# Patient Record
Sex: Female | Born: 1960 | Race: White | Hispanic: No | Marital: Married | Smoking: Never smoker
Health system: Southern US, Community
[De-identification: ages and names within clinical notes are randomized; demographics above are authoritative.]

## PROBLEM LIST (undated history)

## (undated) DIAGNOSIS — C801 Malignant (primary) neoplasm, unspecified: Secondary | ICD-10-CM

## (undated) DIAGNOSIS — I4510 Unspecified right bundle-branch block: Secondary | ICD-10-CM

## (undated) DIAGNOSIS — T7840XA Allergy, unspecified, initial encounter: Secondary | ICD-10-CM

## (undated) HISTORY — PX: ELBOW SURGERY: SHX618

## (undated) HISTORY — PX: ABDOMINAL HYSTERECTOMY: SHX81

## (undated) HISTORY — PX: COLONOSCOPY: SHX174

## (undated) HISTORY — DX: Allergy, unspecified, initial encounter: T78.40XA

---

## 2010-01-03 ENCOUNTER — Encounter: Admission: RE | Admit: 2010-01-03 | Discharge: 2010-01-03 | Payer: Self-pay | Admitting: Gastroenterology

## 2010-02-14 ENCOUNTER — Encounter (HOSPITAL_COMMUNITY): Payer: Self-pay | Admitting: Obstetrics and Gynecology

## 2010-02-14 ENCOUNTER — Inpatient Hospital Stay (HOSPITAL_COMMUNITY): Admission: RE | Admit: 2010-02-14 | Discharge: 2010-02-15 | Payer: Self-pay | Admitting: Obstetrics and Gynecology

## 2011-02-01 LAB — COMPREHENSIVE METABOLIC PANEL
ALT: 20 U/L (ref 0–35)
Albumin: 4.3 g/dL (ref 3.5–5.2)
Alkaline Phosphatase: 36 U/L — ABNORMAL LOW (ref 39–117)
Calcium: 9.2 mg/dL (ref 8.4–10.5)
Creatinine, Ser: 0.73 mg/dL (ref 0.4–1.2)
GFR calc Af Amer: >60 mL/min (ref >60)
Potassium: 3.8 mEq/L (ref 3.5–5.1)
Total Protein: 7 g/dL (ref 6.0–8.3)

## 2011-02-01 LAB — CBC
HCT: 27.3 % — ABNORMAL LOW (ref 36.0–46.0)
HCT: 41.1 % (ref 36.0–46.0)
Hemoglobin: 14 g/dL (ref 12.0–15.0)
Hemoglobin: 9.4 g/dL — ABNORMAL LOW (ref 12.0–15.0)
MCHC: 34 g/dL (ref 30.0–36.0)
MCV: 92.6 fL (ref 78.0–100.0)
MCV: 93.3 fL (ref 78.0–100.0)
Platelets: 249 10*3/uL (ref 150–400)
RDW: 13 % (ref 11.5–15.5)
RDW: 13.1 % (ref 11.5–15.5)
WBC: 10.3 10*3/uL (ref 4.0–10.5)

## 2011-02-01 LAB — ABO/RH: ABO/RH(D): A POS

## 2011-02-01 LAB — PREGNANCY, URINE: Preg Test, Ur: NEGATIVE

## 2015-02-16 ENCOUNTER — Ambulatory Visit (INDEPENDENT_AMBULATORY_CARE_PROVIDER_SITE_OTHER): Payer: BLUE CROSS/BLUE SHIELD

## 2015-02-16 ENCOUNTER — Ambulatory Visit (INDEPENDENT_AMBULATORY_CARE_PROVIDER_SITE_OTHER): Payer: BLUE CROSS/BLUE SHIELD | Admitting: Podiatry

## 2015-02-16 ENCOUNTER — Encounter: Payer: Self-pay | Admitting: Podiatry

## 2015-02-16 VITALS — BP 103/59 | HR 67 | Resp 10 | Ht 68.0 in | Wt 136.0 lb

## 2015-02-16 DIAGNOSIS — M79672 Pain in left foot: Secondary | ICD-10-CM

## 2015-02-16 DIAGNOSIS — M79671 Pain in right foot: Secondary | ICD-10-CM

## 2015-02-16 DIAGNOSIS — M2042 Other hammer toe(s) (acquired), left foot: Secondary | ICD-10-CM | POA: Diagnosis not present

## 2015-02-16 DIAGNOSIS — M779 Enthesopathy, unspecified: Secondary | ICD-10-CM

## 2015-02-16 MED ORDER — TRIAMCINOLONE ACETONIDE 10 MG/ML IJ SUSP
10.0000 mg | Freq: Once | INTRAMUSCULAR | Status: AC
  Administered 2015-02-16: 10 mg

## 2015-02-16 NOTE — Progress Notes (Signed)
   Subjective:    Patient ID: Terri Salazar, female    DOB: [birth year], 54 y.o.   MRN: [id number]  HPI Comments: Pt states she has suffered with the hard rock sensation in the shoe against the left 2-3MPJ area for over 3 years, and has received 3 cortisone injections from Dr. Ballard Russell.  Pt states she has changed to very padded shoes that help some.  Pt states right lower ankle and foot began 3 weeks ago, after training for a 5K, but resting has helped a little.   Foot Pain Associated symptoms include congestion, diaphoresis and headaches.      Review of Systems  Constitutional: Positive for diaphoresis.  HENT: Positive for congestion and sinus pressure.   Cardiovascular: Positive for palpitations.       Poor circulation per pt.  Neurological: Positive for headaches.  Hematological: Bruises/bleeds easily.  All other systems reviewed and are negative.      Objective:   Physical Exam        Assessment & Plan:

## 2015-02-17 NOTE — Progress Notes (Signed)
Subjective:     Patient ID: Terri Salazar, female   DOB: [birth year], 54 y.o.   MRN: [id number]  HPI patient presents stating that I'm having pain in my left second MPJ and that that's been present for several years. I had several cortisone injection along time ago and wear padded shoes but it continues to bother me and I do think that the left second toe may be moving slightly towards the big toe and elevated. My right ankle was bothering me after running and training 45   Review of Systems  All other systems reviewed and are negative.      Objective:   Physical Exam  Constitutional: She is oriented to person, place, and time.  Cardiovascular: Intact distal pulses.   Musculoskeletal: Normal range of motion.  Neurological: She is oriented to person, place, and time.  Skin: Skin is warm.  Nursing note and vitals reviewed.  neurovascular status intact with muscle strength adequate and range of motion subtalar midtarsal joint within normal limits. Patient's noted to have good digital perfusion is well oriented 3 and is noted on the left foot there is mild dorsal and medial dislocation of the second metatarsophalangeal joint with inflammation and fluid within the joint itself. I also noted some mass feeling in the third interspace left but that's nonpainful and there is mild discomfort on the lateral side of the right ankle     Assessment:     Probable inflammatory capsulitis with possible flexor plate stretch or dislocation second MPJ left along with possible neuroma symptomatology non- symptomatic at the time left with mild ankle sprain right that is not significantly painful at this time    Plan:     H&P and all conditions discussed. Were to focus on the second MPJ left and today I went ahead and I did a proximal nerve block and after appropriate numbness I aspirated was able to get out a small amount of clear fluid and injected with half cc of dexamethasone Kenalog. I then applied thick  plantar padding to reduce pressure and discussed long-term orthotics depending on the response or the possibility long-term surgical intervention. Reviewed x-rays on the right foot and do not see reason at this time for treatment but she will utilize ice and support

## 2015-03-01 ENCOUNTER — Ambulatory Visit (INDEPENDENT_AMBULATORY_CARE_PROVIDER_SITE_OTHER): Payer: BLUE CROSS/BLUE SHIELD | Admitting: Podiatry

## 2015-03-01 DIAGNOSIS — M779 Enthesopathy, unspecified: Secondary | ICD-10-CM | POA: Diagnosis not present

## 2015-03-01 DIAGNOSIS — M2042 Other hammer toe(s) (acquired), left foot: Secondary | ICD-10-CM | POA: Diagnosis not present

## 2015-03-02 NOTE — Progress Notes (Signed)
Subjective:     Patient ID: Terri Salazar, female   DOB: 1961-10-01, 54 y.o.   MRN: [id number]  HPI patient states I was able to run my 5K on my left foot and while I'm still getting discomfort  Really seems to make a difference   Review of Systems     Objective:   Physical Exam Neurovascular status intact muscle strength adequate with diminished discomfort of the second MPJ left with fluid buildup still noted and pain when palpated deeply    Assessment:     Capsulitis improved but still present second MPJ left    Plan:     Dispensed and explained padding to patient and scanned for custom orthotics to reduce stress against the joint. Patient will be seen back to recheck again in orthotics are returned

## 2015-03-19 ENCOUNTER — Ambulatory Visit: Payer: BLUE CROSS/BLUE SHIELD | Admitting: *Deleted

## 2015-03-19 DIAGNOSIS — M779 Enthesopathy, unspecified: Secondary | ICD-10-CM

## 2015-03-19 NOTE — Patient Instructions (Signed)

## 2015-03-19 NOTE — Progress Notes (Signed)
Patient ID: Terri Salazar, female   DOB: 1961/04/27, 54 y.o.   MRN: [id number] PICKING UP INSERTS

## 2015-08-25 ENCOUNTER — Other Ambulatory Visit: Payer: Self-pay | Admitting: Gastroenterology

## 2015-08-25 DIAGNOSIS — C187 Malignant neoplasm of sigmoid colon: Secondary | ICD-10-CM

## 2015-08-26 ENCOUNTER — Other Ambulatory Visit: Payer: Self-pay | Admitting: Gastroenterology

## 2015-08-26 ENCOUNTER — Ambulatory Visit (HOSPITAL_COMMUNITY)
Admission: RE | Admit: 2015-08-26 | Discharge: 2015-08-26 | Disposition: A | Discharge status: Home / Self Care | Payer: BLUE CROSS/BLUE SHIELD | Source: Ambulatory Visit | Attending: Gastroenterology | Admitting: Gastroenterology

## 2015-08-26 ENCOUNTER — Encounter (HOSPITAL_COMMUNITY): Payer: Self-pay

## 2015-08-26 DIAGNOSIS — D1803 Hemangioma of intra-abdominal structures: Secondary | ICD-10-CM | POA: Diagnosis not present

## 2015-08-26 DIAGNOSIS — C187 Malignant neoplasm of sigmoid colon: Secondary | ICD-10-CM

## 2015-08-26 HISTORY — DX: Malignant (primary) neoplasm, unspecified: C80.1

## 2015-08-26 MED ORDER — IOHEXOL 300 MG/ML  SOLN
100.0000 mL | Freq: Once | INTRAMUSCULAR | Status: AC | PRN
  Administered 2015-08-26: 100 mL via INTRAVENOUS

## 2015-08-31 ENCOUNTER — Encounter: Payer: Self-pay | Admitting: General Surgery

## 2015-08-31 NOTE — Progress Notes (Signed)
Lajean Silvius 08/31/2015 10:24 AM Location: [facility] Patient #: [id number] DOB: [birth year] Married / Language: Cleophus Molt / Race: White Female  History of Present Illness Odis Hollingshead MD; 08/31/2015 11:31 AM) The patient is a 54 year old female.   Note:She is referred by Dr. 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 Her husband is with her. She saw Dr. Collene Mares because she was having some fecal urgency at times area also had some rectal bleeding. During the summer, she had a couple episodes of fecal incontinence while running or walking on hot days. As the weather has cooled down, she's not had this problem. Colonoscopy demonstrated a lesion at the rectosigmoid junction. Biopsy was performed and the area was marked. 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 CT of the chest and abdomen did not demonstrate any obvious evidence of metastatic disease. No family history of colorectal cancer. No unintended weight loss. No abdominal pain or pelvic pain.  Other Problems Elbert Ewings, CMA; 08/31/2015 10:24 AM) Back Pain Hemorrhoids Rectal Cancer  Past Surgical History Elbert Ewings, CMA; 08/31/2015 10:24 AM) Colon Polyp Removal - Colonoscopy Hysterectomy (not due to cancer) - Partial Oral Surgery  Diagnostic Studies History Elbert Ewings, CMA; 08/31/2015 10:24 AM) Colonoscopy >10 years ago Mammogram >3 years ago Pap Smear >5 years ago  Allergies Elbert Ewings, CMA; 08/31/2015 10:24 AM) No Known Drug Allergies 08/31/2015  Medication History Elbert Ewings, CMA; 08/31/2015 10:25 AM) Glucosamine (500MG  Capsule, Oral) Active. Vitamin D3 Active. Multiple Vitamin (Oral) Active. Medications Reconciled  Social History Elbert Ewings, [place]; 08/31/2015 10:24 AM) Alcohol use Occasional alcohol use. Caffeine use Carbonated beverages, Coffee, Tea. No drug use Tobacco use Never smoker.  Family History Elbert Ewings, [place];  08/31/2015 10:24 AM) Alcohol Abuse Family Members In General. Arthritis Father, Mother. Bleeding disorder Family Members In General. Depression Family Members In General. Melanoma Father. Migraine Headache Mother, Sister. Thyroid problems Mother.  Pregnancy / Birth History Elbert Ewings, [place]; 08/31/2015 10:24 AM) Age at menarche 22 years. Age of menopause 51-55 Contraceptive History Oral contraceptives. Gravida 1 Irregular periods Maternal age 64-40 Para 1     Review of Systems Elbert Ewings CMA; 08/31/2015 10:24 AM) General Present- Fatigue and Night Sweats. Not Present- Appetite Loss, Chills, Fever, Weight Gain and Weight Loss. Skin Not Present- Change in Wart/Mole, Dryness, Hives, Jaundice, New Lesions, Non-Healing Wounds, Rash and Ulcer. HEENT Present- Seasonal Allergies, Sinus Pain, Sore Throat and Wears glasses/contact lenses. Not Present- Earache, Hearing Loss, Hoarseness, Nose Bleed, Oral Ulcers, Ringing in the Ears, Visual Disturbances and Yellow Eyes. Respiratory Not Present- Bloody sputum, Chronic Cough, Difficulty Breathing, Snoring and Wheezing. Breast Not Present- Breast Mass, Breast Pain, Nipple Discharge and Skin Changes. Cardiovascular Not Present- Chest Pain, Difficulty Breathing Lying Down, Leg Cramps, Palpitations, Rapid Heart Rate, Shortness of Breath and Swelling of Extremities. Gastrointestinal Present- Bloating, Change in Bowel Habits, Excessive gas, Nausea and Rectal Pain. Not Present- Abdominal Pain, Bloody Stool, Chronic diarrhea, Constipation, Difficulty Swallowing, Gets full quickly at meals, Hemorrhoids, Indigestion and Vomiting. Female Genitourinary Not Present- Frequency, Nocturia, Painful Urination, Pelvic Pain and Urgency. Musculoskeletal Present- Back Pain, Joint Pain and Joint Stiffness. Not Present- Muscle Pain, Muscle Weakness and Swelling of Extremities. Neurological Present- Headaches. Not Present- Decreased Memory, Fainting,  Numbness, Seizures, Tingling, Tremor, Trouble walking and Weakness. Psychiatric Present- Anxiety and Fearful. Not Present- Bipolar, Change in Sleep Pattern, Depression and Frequent crying. Endocrine Present- Hot flashes. Not Present- Cold Intolerance, Excessive Hunger, Hair Changes, Heat Intolerance and New  Diabetes. Hematology Not Present- Easy Bruising, Excessive bleeding, Gland problems, HIV and Persistent Infections.  Vitals Elbert Ewings CMA; 08/31/2015 10:25 AM) 08/31/2015 10:25 AM Weight: 126.4 lb Height: 68in Body Surface Area: 1.68 m Body Mass Index: 19.22 kg/m  Temp.: 97.61F(Temporal)  Pulse: 74 (Regular)  BP: 122/78 (Sitting, Left Arm, Standard)      Physical Exam Odis Hollingshead MD; 08/31/2015 11:34 AM)  The physical exam findings are as follows: Note:General: Thin female in NAD. Pleasant and cooperative.  HEENT: [place]/AT, no facial masses  EYES: EOMI, no icterus  NECK: Supple, no obvious mass or thyroid enlargement.  CV: RRR, no murmur, no JVD.  CHEST: Breath sounds equal and clear. Respirations nonlabored.  ABDOMEN: Soft, nontender, nondistended, no masses, no organomegaly, active bowel sounds, small lower abdominal scars, no hernias.  ANORECTAL: No fissures. Slightly decreased sphincter tone. No masses.  MUSCULOSKELETAL: FROM, good muscle tone, no edema, no venous stasis changes  LYMPHATIC: No palpable cervical or inguinal adenopathy.  SKIN: No jaundice or suspicious rashes.  NEUROLOGIC: Alert and oriented, answers questions appropriately, normal gait and station.  PSYCHIATRIC: Normal mood, affect , and behavior.    Assessment & Plan Odis Hollingshead MD; 08/31/2015 11:06 AM)  CANCER OF RECTOSIGMOID (COLON) (C19) Impression: No evidence of metastatic disease on CT of chest or abdomen.  Plan: Laparoscopic-assisted low anterior resection. I have explained the procedure and risks of colon resection. Risks include but are not limited to  bleeding, infection, wound problems, anesthesia, anastomotic leak, need for colostomy, need for reoperative surgery, injury to intraabominal organs (such as intestine, spleen, kidney, bladder, ureter, etc.), ileus, irregular bowel habits. She seems to understand and agrees to proceed.  FECAL SOILING DUE TO FECAL INCONTINENCE (R15.9) Impression: This occurred when she was running and walking only during the summer. It has not occurred recently as weather has cooled down. She does have decreased sphincter tone on exam.  Plan: Pelvic floor exercises and referral for pelvic floor PT preop. Would not do a sphincteroplasty at the same time as I don't think this is indicated in her current state.  Jackolyn Confer, MD

## 2015-09-10 ENCOUNTER — Encounter (HOSPITAL_COMMUNITY): Payer: Self-pay

## 2015-09-10 ENCOUNTER — Encounter (HOSPITAL_COMMUNITY)
Admission: RE | Admit: 2015-09-10 | Discharge: 2015-09-10 | Disposition: A | Discharge status: Home / Self Care | Payer: BLUE CROSS/BLUE SHIELD | Source: Ambulatory Visit | Attending: General Surgery | Admitting: General Surgery

## 2015-09-10 HISTORY — DX: Unspecified right bundle-branch block: I45.10

## 2015-09-10 LAB — CBC WITH DIFFERENTIAL/PLATELET
BASOS ABS: 0.1 10*3/uL (ref 0.0–0.1)
Basophils Relative: 1 %
Eosinophils Absolute: 0 10*3/uL (ref 0.0–0.7)
Eosinophils Relative: 1 %
HEMATOCRIT: 39.7 % (ref 36.0–46.0)
HEMOGLOBIN: 13.5 g/dL (ref 12.0–15.0)
LYMPHS PCT: 39 %
Lymphs Abs: 2.2 10*3/uL (ref 0.7–4.0)
MCH: 30.5 pg (ref 26.0–34.0)
MCHC: 34 g/dL (ref 30.0–36.0)
MCV: 89.8 fL (ref 78.0–100.0)
Monocytes Absolute: 0.5 10*3/uL (ref 0.1–1.0)
Monocytes Relative: 9 %
NEUTROS ABS: 2.9 10*3/uL (ref 1.7–7.7)
NEUTROS PCT: 50 %
Platelets: 272 10*3/uL (ref 150–400)
RBC: 4.42 MIL/uL (ref 3.87–5.11)
RDW: 12.6 % (ref 11.5–15.5)
WBC: 5.7 10*3/uL (ref 4.0–10.5)

## 2015-09-10 LAB — COMPREHENSIVE METABOLIC PANEL
ALBUMIN: 4.6 g/dL (ref 3.5–5.0)
ALT: 18 U/L (ref 14–54)
AST: 20 U/L (ref 15–41)
Alkaline Phosphatase: 61 U/L (ref 38–126)
Anion gap: 8 (ref 5–15)
BILIRUBIN TOTAL: 0.5 mg/dL (ref 0.3–1.2)
BUN: 15 mg/dL (ref 6–20)
CO2: 27 mmol/L (ref 22–32)
CREATININE: 0.72 mg/dL (ref 0.44–1.00)
Calcium: 9.6 mg/dL (ref 8.9–10.3)
Chloride: 106 mmol/L (ref 101–111)
GFR calc Af Amer: >60 mL/min (ref >60)
GLUCOSE: 99 mg/dL (ref 65–99)
Potassium: 4.6 mmol/L (ref 3.5–5.1)
Sodium: 141 mmol/L (ref 135–145)
TOTAL PROTEIN: 7.1 g/dL (ref 6.5–8.1)

## 2015-09-10 LAB — PROTIME-INR
INR: 1.01 (ref 0.00–1.49)
PROTHROMBIN TIME: 13.5 s (ref 11.6–15.2)

## 2015-09-10 LAB — ABO/RH: ABO/RH(D): A POS

## 2015-09-10 NOTE — Patient Instructions (Addendum)
Terri Salazar  09/10/2015   Your procedure is scheduled on: Monday 09-13-15  Report to [facility] Main  Entrance take [facility]  elevators to 3rd floor to  [place] [facility] [place].  Call this number if you have problems the morning of surgery [phone]   Remember: ONLY 1 PERSON MAY GO WITH YOU TO SHORT STAY TO GET  READY MORNING OF [place].              Follow Bowel prep instructions from Dr. Zella Richer               Do not eat food or drink liquids :After Midnight.     Take these medicines the morning of surgery with A SIP OF WATER: NONE                          DO NOT TAKE ANY DIABETIC MEDICATIONS DAY OF YOUR SURGERY                               You may not have any metal on your body including hair pins and              piercings  Do not wear jewelry, make-up, lotions, powders or perfumes, deodorant             Do not wear nail polish.  Do not shave  48 hours prior to surgery.              Men may shave face and neck.   Do not bring valuables to the hospital. [place].  Contacts, dentures or bridgework may not be worn into surgery.  Leave suitcase in the car. After surgery it may be brought to your room.     Special Instructions: practice deep breathing and leg exercises               Please read over the following fact sheets you were given: _____________________________________________________________________             [facility] - Preparing for Surgery Before surgery, you can play an important role.  Because skin is not sterile, your skin needs to be as free of germs as possible.  You can reduce the number of germs on your skin by washing with CHG (chlorahexidine gluconate) soap before surgery.  CHG is an antiseptic cleaner which kills germs and bonds with the skin to continue killing germs even after washing. Please DO NOT use if you have an allergy to CHG or antibacterial soaps.  If  your skin becomes reddened/irritated stop using the CHG and inform your nurse when you arrive at Short Stay. Do not shave (including legs and underarms) for at least 48 hours prior to the first CHG shower.  You may shave your face/neck. Please follow these instructions carefully:  1.  Shower with CHG Soap the night before surgery and the  morning of Surgery.  2.  If you choose to wash your hair, wash your hair first as usual with your  normal  shampoo.  3.  After you shampoo, rinse your hair and body thoroughly to remove the  shampoo.  4.  Use CHG as you would any other liquid soap.  You can apply chg directly  to the skin and wash                       Gently with a scrungie or clean washcloth.  5.  Apply the CHG Soap to your body ONLY FROM THE NECK DOWN.   Do not use on face/ open                           Wound or open sores. Avoid contact with eyes, ears mouth and genitals (private parts).                       Wash face,  Genitals (private parts) with your normal soap.             6.  Wash thoroughly, paying special attention to the area where your surgery  will be performed.  7.  Thoroughly rinse your body with warm water from the neck down.  8.  DO NOT shower/wash with your normal soap after using and rinsing off  the CHG Soap.                9.  Pat yourself dry with a clean towel.            10.  Wear clean pajamas.            11.  Place clean sheets on your bed the night of your first shower and do not  sleep with pets. Day of Surgery : Do not apply any lotions/deodorants the morning of surgery.  Please wear clean clothes to the hospital/surgery center.  FAILURE TO FOLLOW THESE INSTRUCTIONS MAY RESULT IN THE CANCELLATION OF YOUR SURGERY PATIENT SIGNATURE_________________________________  NURSE SIGNATURE__________________________________  ________________________________________________________________________  WHAT IS A BLOOD TRANSFUSION? Blood  Transfusion Information  A transfusion is the replacement of blood or some of its parts. Blood is made up of multiple cells which provide different functions.  Red blood cells carry oxygen and are used for blood loss replacement.  White blood cells fight against infection.  Platelets control bleeding.  Plasma helps clot blood.  Other blood products are available for specialized needs, such as hemophilia or other clotting disorders. BEFORE THE TRANSFUSION  Who gives blood for transfusions?   Healthy volunteers who are fully evaluated to make sure their blood is safe. This is blood bank blood. Transfusion therapy is the safest it has ever been in the practice of medicine. Before blood is taken from a donor, a complete history is taken to make sure that person has no history of diseases nor engages in risky social behavior (examples are intravenous drug use or sexual activity with multiple partners). The donor's travel history is screened to minimize risk of transmitting infections, such as malaria. The donated blood is tested for signs of infectious diseases, such as HIV and hepatitis. The blood is then tested to be sure it is compatible with you in order to minimize the chance of a transfusion reaction. If you or a relative donates blood, this is often done in anticipation of surgery and is not appropriate for emergency situations. It takes many days to process the donated blood. RISKS AND COMPLICATIONS Although transfusion therapy is very safe and saves many lives, the main dangers of transfusion include:   Getting an infectious disease.  Developing a transfusion reaction. This  is an allergic reaction to something in the blood you were given. Every precaution is taken to prevent this. The decision to have a blood transfusion has been considered carefully by your caregiver before blood is given. Blood is not given unless the benefits outweigh the risks. AFTER THE TRANSFUSION  Right after  receiving a blood transfusion, you will usually feel much better and more energetic. This is especially true if your red blood cells have gotten low (anemic). The transfusion raises the level of the red blood cells which carry oxygen, and this usually causes an energy increase.  The nurse administering the transfusion will monitor you carefully for complications. HOME CARE INSTRUCTIONS  No special instructions are needed after a transfusion. You may find your energy is better. Speak with your caregiver about any limitations on activity for underlying diseases you may have. SEEK MEDICAL CARE IF:   Your condition is not improving after your transfusion.  You develop redness or irritation at the intravenous (IV) site. SEEK IMMEDIATE MEDICAL CARE IF:  Any of the following symptoms occur over the next 12 hours:  Shaking chills.  You have a temperature by mouth above 102 F (38.9 C), not controlled by medicine.  Chest, back, or muscle pain.  People around you feel you are not acting correctly or are confused.  Shortness of breath or difficulty breathing.  Dizziness and fainting.  You get a rash or develop hives.  You have a decrease in urine output.  Your urine turns a dark color or changes to pink, red, or brown. Any of the following symptoms occur over the next 10 days:  You have a temperature by mouth above 102 F (38.9 C), not controlled by medicine.  Shortness of breath.  Weakness after normal activity.  The white part of the eye turns yellow (jaundice).  You have a decrease in the amount of urine or are urinating less often.  Your urine turns a dark color or changes to pink, red, or brown. Document Released: 10/27/2000 Document Revised: 01/22/2012 Document Reviewed: 06/15/2008 ExitCare Patient Information 2014 ExitCare, [place].  _______________________________________________________________________   CLEAR LIQUID DIET   Foods Allowed                                                                      Foods Excluded  Coffee and tea, regular and decaf                             liquids that you cannot  Plain Jell-O in any flavor                                             see through such as: Fruit ices (not with fruit pulp)                                     milk, soups, orange juice  Iced Popsicles  All solid food Carbonated beverages, regular and diet                                    Cranberry, grape and apple juices Sports drinks like Gatorade Lightly seasoned clear broth or consume(fat free) Sugar, honey syrup  Sample Menu Breakfast                                Lunch                                     Supper Cranberry juice                    Beef broth                            Chicken broth Jell-O                                     Grape juice                           Apple juice Coffee or tea                        Jell-O                                      Popsicle                                                Coffee or tea                        Coffee or tea  _____________________________________________________________________

## 2015-09-11 LAB — HEMOGLOBIN A1C
HEMOGLOBIN A1C: 5.6 % (ref 4.8–5.6)
Mean Plasma Glucose: 114 mg/dL

## 2015-09-11 LAB — CEA: CEA: 1.8 ng/mL (ref 0.0–4.7)

## 2015-09-13 ENCOUNTER — Encounter: Payer: Self-pay | Admitting: Anatomic Pathology & Clinical Pathology

## 2015-09-13 ENCOUNTER — Inpatient Hospital Stay (HOSPITAL_COMMUNITY)
Admission: RE | Admit: 2015-09-13 | Discharge: 2015-09-17 | DRG: 376 | Disposition: A | Discharge status: Home / Self Care | Payer: BLUE CROSS/BLUE SHIELD | Source: Ambulatory Visit | Attending: General Surgery | Admitting: General Surgery

## 2015-09-13 ENCOUNTER — Encounter (HOSPITAL_COMMUNITY)
Admission: RE | Disposition: A | Discharge status: Home / Self Care | Payer: Self-pay | Source: Ambulatory Visit | Attending: General Surgery

## 2015-09-13 ENCOUNTER — Inpatient Hospital Stay (HOSPITAL_COMMUNITY): Payer: BLUE CROSS/BLUE SHIELD | Admitting: Anesthesiology

## 2015-09-13 ENCOUNTER — Encounter (HOSPITAL_COMMUNITY): Payer: Self-pay

## 2015-09-13 DIAGNOSIS — C19 Malignant neoplasm of rectosigmoid junction: Secondary | ICD-10-CM | POA: Diagnosis present

## 2015-09-13 DIAGNOSIS — Z01812 Encounter for preprocedural laboratory examination: Secondary | ICD-10-CM

## 2015-09-13 DIAGNOSIS — R152 Fecal urgency: Secondary | ICD-10-CM | POA: Diagnosis present

## 2015-09-13 DIAGNOSIS — Z85048 Personal history of other malignant neoplasm of rectum, rectosigmoid junction, and anus: Secondary | ICD-10-CM | POA: Diagnosis present

## 2015-09-13 DIAGNOSIS — C20 Malignant neoplasm of rectum: Secondary | ICD-10-CM | POA: Diagnosis present

## 2015-09-13 HISTORY — PX: LAPAROSCOPIC PARTIAL COLECTOMY: SHX5907

## 2015-09-13 LAB — TYPE AND SCREEN
ABO/RH(D): A POS
Antibody Screen: NEGATIVE

## 2015-09-13 SURGERY — LAPAROSCOPIC PARTIAL COLECTOMY
Anesthesia: General | Site: Abdomen

## 2015-09-13 MED ORDER — HYDROMORPHONE HCL 1 MG/ML IJ SOLN
0.2500 mg | INTRAMUSCULAR | Status: DC | PRN
  Administered 2015-09-13 (×2): 0.5 mg via INTRAVENOUS

## 2015-09-13 MED ORDER — MORPHINE SULFATE 2 MG/ML IV SOLN
INTRAVENOUS | Status: DC
  Administered 2015-09-13: 16:00:00 via INTRAVENOUS
  Administered 2015-09-13: 1.5 mg via INTRAVENOUS
  Administered 2015-09-13: via INTRAVENOUS
  Administered 2015-09-13: 1.5 mg via INTRAVENOUS
  Administered 2015-09-13: 13.5 mg via INTRAVENOUS
  Administered 2015-09-14: 9.75 mg via INTRAVENOUS
  Filled 2015-09-13: qty 25

## 2015-09-13 MED ORDER — ONDANSETRON HCL 4 MG/2ML IJ SOLN
INTRAMUSCULAR | Status: AC
  Filled 2015-09-13: qty 2

## 2015-09-13 MED ORDER — PROPOFOL 10 MG/ML IV BOLUS
INTRAVENOUS | Status: AC
  Filled 2015-09-13: qty 20

## 2015-09-13 MED ORDER — MIDAZOLAM HCL 5 MG/5ML IJ SOLN
INTRAMUSCULAR | Status: DC | PRN
  Administered 2015-09-13: 2 mg via INTRAVENOUS

## 2015-09-13 MED ORDER — ALBUMIN HUMAN 5 % IV SOLN
INTRAVENOUS | Status: AC
  Filled 2015-09-13: qty 250

## 2015-09-13 MED ORDER — DEXAMETHASONE SODIUM PHOSPHATE 10 MG/ML IJ SOLN
INTRAMUSCULAR | Status: DC | PRN
  Administered 2015-09-13: 10 mg via INTRAVENOUS

## 2015-09-13 MED ORDER — DEXAMETHASONE SODIUM PHOSPHATE 10 MG/ML IJ SOLN
INTRAMUSCULAR | Status: AC
Start: 2015-09-13 — End: 2015-09-13
  Filled 2015-09-13: qty 1

## 2015-09-13 MED ORDER — ONDANSETRON HCL 4 MG/2ML IJ SOLN
INTRAMUSCULAR | Status: DC | PRN
  Administered 2015-09-13: 4 mg via INTRAVENOUS

## 2015-09-13 MED ORDER — ALBUMIN HUMAN 5 % IV SOLN
INTRAVENOUS | Status: DC | PRN
  Administered 2015-09-13: 14:00:00 via INTRAVENOUS

## 2015-09-13 MED ORDER — DIPHENHYDRAMINE HCL 12.5 MG/5ML PO ELIX: 12.5000 mg | ORAL_SOLUTION | Freq: Four times a day (QID) | ORAL | Status: DC | PRN

## 2015-09-13 MED ORDER — KCL IN DEXTROSE-NACL 20-5-0.9 MEQ/L-%-% IV SOLN
INTRAVENOUS | Status: DC
  Administered 2015-09-13: 125 mL/h via INTRAVENOUS
  Administered 2015-09-13: via INTRAVENOUS
  Administered 2015-09-14: 100 mL/h via INTRAVENOUS
  Administered 2015-09-14: 10:00:00 via INTRAVENOUS
  Administered 2015-09-16: 50 mL/h via INTRAVENOUS
  Filled 2015-09-13 (×8): qty 1000

## 2015-09-13 MED ORDER — PHENYLEPHRINE HCL 10 MG/ML IJ SOLN
INTRAMUSCULAR | Status: DC | PRN
  Administered 2015-09-13 (×2): 80 ug via INTRAVENOUS

## 2015-09-13 MED ORDER — LIDOCAINE HCL (CARDIAC) 20 MG/ML IV SOLN
INTRAVENOUS | Status: DC | PRN
  Administered 2015-09-13: 25 mg via INTRATRACHEAL
  Administered 2015-09-13: 75 mg via INTRAVENOUS

## 2015-09-13 MED ORDER — HEPARIN SODIUM (PORCINE) 5000 UNIT/ML IJ SOLN
5000.0000 [IU] | Freq: Three times a day (TID) | INTRAMUSCULAR | Status: DC
  Administered 2015-09-14 – 2015-09-17 (×10): 5000 [IU] via SUBCUTANEOUS
  Filled 2015-09-13 (×13): qty 1

## 2015-09-13 MED ORDER — ONDANSETRON HCL 4 MG/2ML IJ SOLN: 4.0000 mg | Freq: Four times a day (QID) | INTRAMUSCULAR | Status: DC | PRN

## 2015-09-13 MED ORDER — FENTANYL CITRATE (PF) 250 MCG/5ML IJ SOLN
INTRAMUSCULAR | Status: AC
  Filled 2015-09-13: qty 25

## 2015-09-13 MED ORDER — ONDANSETRON HCL 4 MG/2ML IJ SOLN
4.0000 mg | Freq: Four times a day (QID) | INTRAMUSCULAR | Status: DC | PRN
Start: 2015-09-13 — End: 2015-09-13

## 2015-09-13 MED ORDER — MORPHINE SULFATE 2 MG/ML IV SOLN
INTRAVENOUS | Status: AC
  Filled 2015-09-13: qty 25

## 2015-09-13 MED ORDER — CEFOTETAN DISODIUM-DEXTROSE 2-2.08 GM-% IV SOLR
INTRAVENOUS | Status: AC
  Filled 2015-09-13: qty 50

## 2015-09-13 MED ORDER — DEXTROSE 5 % IV SOLN
2.0000 g | Freq: Two times a day (BID) | INTRAVENOUS | Status: AC
  Administered 2015-09-13: 2 g via INTRAVENOUS
  Filled 2015-09-13 (×2): qty 2

## 2015-09-13 MED ORDER — LACTATED RINGERS IV SOLN: INTRAVENOUS | Status: DC

## 2015-09-13 MED ORDER — LACTATED RINGERS IR SOLN
Status: DC | PRN
  Administered 2015-09-13: 1000 mL

## 2015-09-13 MED ORDER — ROCURONIUM BROMIDE 100 MG/10ML IV SOLN
INTRAVENOUS | Status: AC
  Filled 2015-09-13: qty 1

## 2015-09-13 MED ORDER — DIPHENHYDRAMINE HCL 50 MG/ML IJ SOLN: 12.5000 mg | Freq: Four times a day (QID) | INTRAMUSCULAR | Status: DC | PRN

## 2015-09-13 MED ORDER — LACTATED RINGERS IV SOLN
INTRAVENOUS | Status: DC
  Administered 2015-09-13 (×2): via INTRAVENOUS
  Administered 2015-09-13: 1000 mL via INTRAVENOUS
  Administered 2015-09-13: 13:00:00 via INTRAVENOUS

## 2015-09-13 MED ORDER — PROPOFOL 10 MG/ML IV BOLUS
INTRAVENOUS | Status: DC | PRN
  Administered 2015-09-13: 130 mg via INTRAVENOUS

## 2015-09-13 MED ORDER — PHENYLEPHRINE 40 MCG/ML (10ML) SYRINGE FOR IV PUSH (FOR BLOOD PRESSURE SUPPORT)
PREFILLED_SYRINGE | INTRAVENOUS | Status: AC
  Filled 2015-09-13: qty 10

## 2015-09-13 MED ORDER — CHLORHEXIDINE GLUCONATE CLOTH 2 % EX PADS: 6.0000 | MEDICATED_PAD | Freq: Once | CUTANEOUS | Status: DC

## 2015-09-13 MED ORDER — GLYCOPYRROLATE 0.2 MG/ML IJ SOLN
INTRAMUSCULAR | Status: DC | PRN
  Administered 2015-09-13: 0.6 mg via INTRAVENOUS

## 2015-09-13 MED ORDER — ROCURONIUM BROMIDE 100 MG/10ML IV SOLN
INTRAVENOUS | Status: DC | PRN
  Administered 2015-09-13: 20 mg via INTRAVENOUS
  Administered 2015-09-13: 10 mg via INTRAVENOUS
  Administered 2015-09-13: 50 mg via INTRAVENOUS
  Administered 2015-09-13 (×2): 10 mg via INTRAVENOUS

## 2015-09-13 MED ORDER — PANTOPRAZOLE SODIUM 40 MG IV SOLR
40.0000 mg | INTRAVENOUS | Status: DC
  Administered 2015-09-13 – 2015-09-15 (×3): 40 mg via INTRAVENOUS
  Filled 2015-09-13 (×4): qty 40

## 2015-09-13 MED ORDER — DEXTROSE 5 % IV SOLN
2.0000 g | INTRAVENOUS | Status: AC
  Administered 2015-09-13: 2 g via INTRAVENOUS
  Filled 2015-09-13: qty 2

## 2015-09-13 MED ORDER — HYDROMORPHONE HCL 1 MG/ML IJ SOLN
INTRAMUSCULAR | Status: DC | PRN
  Administered 2015-09-13: .8 mg via INTRAVENOUS
  Administered 2015-09-13 (×2): .4 mg via INTRAVENOUS
  Administered 2015-09-13 (×2): .2 mg via INTRAVENOUS

## 2015-09-13 MED ORDER — HYDROMORPHONE HCL 1 MG/ML IJ SOLN
INTRAMUSCULAR | Status: AC
  Filled 2015-09-13: qty 1

## 2015-09-13 MED ORDER — GLYCOPYRROLATE 0.2 MG/ML IJ SOLN
INTRAMUSCULAR | Status: AC
  Filled 2015-09-13: qty 3

## 2015-09-13 MED ORDER — NALOXONE HCL 0.4 MG/ML IJ SOLN: 0.4000 mg | INTRAMUSCULAR | Status: DC | PRN

## 2015-09-13 MED ORDER — FENTANYL CITRATE (PF) 250 MCG/5ML IJ SOLN
INTRAMUSCULAR | Status: DC | PRN
  Administered 2015-09-13: 100 ug via INTRAVENOUS
  Administered 2015-09-13 (×5): 50 ug via INTRAVENOUS

## 2015-09-13 MED ORDER — ALVIMOPAN 12 MG PO CAPS
12.0000 mg | ORAL_CAPSULE | Freq: Two times a day (BID) | ORAL | Status: DC
  Administered 2015-09-14 – 2015-09-16 (×6): 12 mg via ORAL
  Filled 2015-09-13 (×8): qty 1

## 2015-09-13 MED ORDER — NEOSTIGMINE METHYLSULFATE 10 MG/10ML IV SOLN
INTRAVENOUS | Status: DC | PRN
  Administered 2015-09-13: 4 mg via INTRAVENOUS

## 2015-09-13 MED ORDER — 0.9 % SODIUM CHLORIDE (POUR BTL) OPTIME
TOPICAL | Status: DC | PRN
  Administered 2015-09-13: 5000 mL

## 2015-09-13 MED ORDER — ALVIMOPAN 12 MG PO CAPS
12.0000 mg | ORAL_CAPSULE | Freq: Once | ORAL | Status: AC
Start: 2015-09-13 — End: 2015-09-13
  Administered 2015-09-13: 12 mg via ORAL
  Filled 2015-09-13: qty 1

## 2015-09-13 MED ORDER — HYDROMORPHONE HCL 2 MG/ML IJ SOLN
INTRAMUSCULAR | Status: AC
  Filled 2015-09-13: qty 1

## 2015-09-13 MED ORDER — BUPIVACAINE HCL (PF) 0.5 % IJ SOLN
INTRAMUSCULAR | Status: AC
  Filled 2015-09-13: qty 30

## 2015-09-13 MED ORDER — BUPIVACAINE HCL (PF) 0.5 % IJ SOLN
INTRAMUSCULAR | Status: DC | PRN
  Administered 2015-09-13: 15 mL

## 2015-09-13 MED ORDER — MIDAZOLAM HCL 2 MG/2ML IJ SOLN
INTRAMUSCULAR | Status: AC
  Filled 2015-09-13: qty 4

## 2015-09-13 MED ORDER — SODIUM CHLORIDE 0.9 % IJ SOLN: 9.0000 mL | INTRAMUSCULAR | Status: DC | PRN

## 2015-09-13 MED ORDER — FENTANYL CITRATE (PF) 100 MCG/2ML IJ SOLN
INTRAMUSCULAR | Status: AC
  Filled 2015-09-13: qty 2

## 2015-09-13 MED ORDER — ONDANSETRON HCL 4 MG PO TABS: 4.0000 mg | ORAL_TABLET | Freq: Four times a day (QID) | ORAL | Status: DC | PRN

## 2015-09-13 SURGICAL SUPPLY — 80 items
APPLIER CLIP 5 13 M/L LIGAMAX5 (MISCELLANEOUS)
APPLIER CLIP ROT 10 11.4 M/L (STAPLE)
BLADE EXTENDED COATED 6.5IN (ELECTRODE) ×2 IMPLANT
CABLE HIGH FREQUENCY MONO STRZ (ELECTRODE) ×2 IMPLANT
CELLS DAT CNTRL 66122 CELL SVR (MISCELLANEOUS) IMPLANT
CHLORAPREP W/TINT 26ML (MISCELLANEOUS) ×2 IMPLANT
CLIP APPLIE 5 13 M/L LIGAMAX5 (MISCELLANEOUS) IMPLANT
CLIP APPLIE ROT 10 11.4 M/L (STAPLE) IMPLANT
COUNTER NEEDLE 20 DBL MAG RED (NEEDLE) ×2 IMPLANT
COVER MAYO STAND STRL (DRAPES) ×6 IMPLANT
COVER SURGICAL LIGHT HANDLE (MISCELLANEOUS) IMPLANT
DECANTER SPIKE VIAL GLASS SM (MISCELLANEOUS) ×2 IMPLANT
DISSECTOR BLUNT TIP ENDO 5MM (MISCELLANEOUS) IMPLANT
DRAIN CHANNEL 19F RND (DRAIN) IMPLANT
DRAPE CAMERA CLOSED 9X96 (DRAPES) IMPLANT
DRAPE LAPAROSCOPIC ABDOMINAL (DRAPES) ×2 IMPLANT
DRAPE LG THREE QUARTER DISP (DRAPES) ×2 IMPLANT
DRAPE SURG IRRIG POUCH 19X23 (DRAPES) IMPLANT
DRAPE UTILITY XL STRL (DRAPES) IMPLANT
DRSG OPSITE POSTOP 4X10 (GAUZE/BANDAGES/DRESSINGS) IMPLANT
DRSG OPSITE POSTOP 4X6 (GAUZE/BANDAGES/DRESSINGS) IMPLANT
DRSG OPSITE POSTOP 4X8 (GAUZE/BANDAGES/DRESSINGS) ×2 IMPLANT
DRSG TEGADERM 2-3/8X2-3/4 SM (GAUZE/BANDAGES/DRESSINGS) ×6 IMPLANT
ELECT PENCIL ROCKER SW 15FT (MISCELLANEOUS) ×4 IMPLANT
ELECT REM PT RETURN 15FT ADLT (MISCELLANEOUS) ×2 IMPLANT
EVACUATOR SILICONE 100CC (DRAIN) ×2 IMPLANT
FILTER SMOKE EVAC LAPAROSHD (FILTER) IMPLANT
GAUZE SPONGE 2X2 8PLY STRL LF (GAUZE/BANDAGES/DRESSINGS) IMPLANT
GAUZE SPONGE 4X4 12PLY STRL (GAUZE/BANDAGES/DRESSINGS) ×2 IMPLANT
GAUZE SPONGE 4X4 16PLY XRAY LF (GAUZE/BANDAGES/DRESSINGS) ×2 IMPLANT
GLOVE ECLIPSE 8.0 STRL XLNG CF (GLOVE) ×4 IMPLANT
GLOVE INDICATOR 8.0 STRL GRN (GLOVE) ×4 IMPLANT
GOWN STRL REUS W/TWL XL LVL3 (GOWN DISPOSABLE) ×8 IMPLANT
LEGGING LITHOTOMY PAIR STRL (DRAPES) ×2 IMPLANT
LIGASURE IMPACT 36 18CM CVD LR (INSTRUMENTS) ×2 IMPLANT
MANIFOLD NEPTUNE II (INSTRUMENTS) ×2 IMPLANT
PACK COLON (CUSTOM PROCEDURE TRAY) ×2 IMPLANT
PAD POSITIONING PINK XL (MISCELLANEOUS) ×2 IMPLANT
PORT LAP GEL ALEXIS MED 5-9CM (MISCELLANEOUS) IMPLANT
RELOAD PROXIMATE 75MM BLUE (ENDOMECHANICALS) ×2 IMPLANT
RTRCTR WOUND ALEXIS 18CM MED (MISCELLANEOUS)
SCISSORS LAP 5X35 DISP (ENDOMECHANICALS) ×2 IMPLANT
SET IRRIG TUBING LAPAROSCOPIC (IRRIGATION / IRRIGATOR) IMPLANT
SHEARS HARMONIC ACE PLUS 36CM (ENDOMECHANICALS) ×2 IMPLANT
SHEARS HARMONIC ACE PLUS 45CM (MISCELLANEOUS) IMPLANT
SLEEVE SURGEON STRL (DRAPES) ×2 IMPLANT
SLEEVE XCEL OPT CAN 5 100 (ENDOMECHANICALS) ×10 IMPLANT
SPONGE DRAIN TRACH 4X4 STRL 2S (GAUZE/BANDAGES/DRESSINGS) ×2 IMPLANT
SPONGE GAUZE 2X2 STER 10/PKG (GAUZE/BANDAGES/DRESSINGS)
SPONGE LAP 18X18 X RAY DECT (DISPOSABLE) ×2 IMPLANT
STAPLER CUT CVD 40MM BLUE (STAPLE) ×2 IMPLANT
STAPLER PROXIMATE 75MM BLUE (STAPLE) ×2 IMPLANT
STAPLER VISISTAT 35W (STAPLE) ×2 IMPLANT
STRIP CLOSURE SKIN 1/2X4 (GAUZE/BANDAGES/DRESSINGS) ×2 IMPLANT
SUCTION POOLE TIP (SUCTIONS) IMPLANT
SUT ETHILON 3 0 PS 1 (SUTURE) ×2 IMPLANT
SUT MNCRL AB 4-0 PS2 18 (SUTURE) ×4 IMPLANT
SUT PDS AB 1 CTX 36 (SUTURE) IMPLANT
SUT PDS AB 1 TP1 96 (SUTURE) IMPLANT
SUT PROLENE 2 0 BLUE (SUTURE) ×4 IMPLANT
SUT PROLENE 2 0 KS (SUTURE) ×2 IMPLANT
SUT PROLENE 2 0 SH DA (SUTURE) IMPLANT
SUT SILK 2 0 (SUTURE) ×1
SUT SILK 2 0 SH CR/8 (SUTURE) ×2 IMPLANT
SUT SILK 2-0 18XBRD TIE 12 (SUTURE) ×1 IMPLANT
SUT SILK 3 0 (SUTURE) ×1
SUT SILK 3 0 SH CR/8 (SUTURE) ×2 IMPLANT
SUT SILK 3-0 18XBRD TIE 12 (SUTURE) ×1 IMPLANT
SUT VICRYL 2 0 18  UND BR (SUTURE)
SUT VICRYL 2 0 18 UND BR (SUTURE) IMPLANT
SYS LAPSCP GELPORT 120MM (MISCELLANEOUS)
SYSTEM LAPSCP GELPORT 120MM (MISCELLANEOUS) IMPLANT
TOWEL OR 17X26 10 PK STRL BLUE (TOWEL DISPOSABLE) IMPLANT
TOWEL OR NON WOVEN STRL DISP B (DISPOSABLE) ×2 IMPLANT
TRAY FOLEY W/METER SILVER 14FR (SET/KITS/TRAYS/PACK) ×2 IMPLANT
TRAY FOLEY W/METER SILVER 16FR (SET/KITS/TRAYS/PACK) ×2 IMPLANT
TROCAR BLADELESS OPT 5 100 (ENDOMECHANICALS) ×2 IMPLANT
TROCAR XCEL BLUNT TIP 100MML (ENDOMECHANICALS) IMPLANT
TROCAR XCEL NON-BLD 11X100MML (ENDOMECHANICALS) IMPLANT
TUBING FILTER THERMOFLATOR (ELECTROSURGICAL) ×2 IMPLANT

## 2015-09-13 NOTE — Interval H&P Note (Signed)
History and Physical Interval Note:  09/13/2015 3:22 PM  Terri Salazar  has presented today for surgery, with the diagnosis of Colon Cancer  The various methods of treatment have been discussed with the patient and family. After consideration of risks, benefits and other options for treatment, the patient has consented to  Procedure(s): LAPAROSCOPIC LOW ANTERIOR RESECTION OF RECTUM WITH MOBILIZATION OF SPLENIC FLEXURE (N/A) as a surgical intervention .  The patient's history has been reviewed, patient examined, no change in status, stable for surgery.  I have reviewed the patient's chart and labs.  Questions were answered to the patient's satisfaction.     Earl Zellmer Lenna Sciara

## 2015-09-13 NOTE — Op Note (Signed)
Operative Note  Terri Salazar female 54 y.o. 09/13/2015  PREOPERATIVE DX:  Rectal cancer  POSTOPERATIVE DX:  Same  PROCEDURE:   Laparoscopic assisted low anterior resection of the rectum with mobilization of splenic flexure         Surgeon: Odis Hollingshead   Assistants: Gurney Maxin, MD  Anesthesia: General endotracheal anesthesia  Indications:   This is a 54 year old female who is having some episodes of rectal bleeding and fecal urgency. She went for colonoscopy. A 2 cm lesion was noted in the rectal area possible rectosigmoid junction. Biopsy was consistent with adenocarcinoma. CT scan does not show evidence of metastatic disease. She now presents for the above procedure.    Procedure Detail:  She was brought to the operating room and placed supine on the operating table and general anesthetic was administered. She was placed in lithotomy position. Hair in the upper pubic area was clipped. A Foley catheter was inserted. An oral gastric tube is inserted.  The abdominal wall and perineal areas were then widely sterilely prepped and draped.  A timeout was performed.  She was placed in slight reverse Trendelenburg position. A 5 mm incision was made in the left subcostal area. Using a 5 mm Optiview trocar and laparoscope, access was gained into the peritoneal cavity. A pneumoperitoneum was created. Visualization underneath the trocar demonstrated no evidence of organ injury or bleeding. A 5 mm trocar was placed in the right lower quadrant. A 5 mm trocar was placed in the supraumbilical area. A 5 mm trocar was placed in the lower midline.  The proximal sigmoid colon and descending colon were identified. The descending colon was mobilized by dividing its lateral attachments sharply up toward the splenic flexure. Then mobilized the splenic flexure using the Harmonic scalpel and sharp and blunt dissection. I got the splenic flexure to drop down to the level of the umbilicus. I then further  mobilized the sigmoid colon down to the pelvis and identified the left ureter. A 5 mm trocar was placed in the left lower quadrant to assist with mobilization.The plane of dissection was kept above the ureter. The rectosigmoid junction was mobilized laterally and medially by dividing lateral attachments. I could not identify the tattoo marking in the rectosigmoid area.  The lower midline trocar was removed and a limited lower midline incision was made as an extraction site through all layers. A wound protection device was placed.  Rigid proctosigmoidoscopy was performed and I could identify the tumor posteriorly in the mid rectal area.  I could then palpate the tumor below the peritoneal reflection in the mid rectal area. I further mobilized the lateral aspect of the rectum. Right ureter was identified and kept out of the plane of dissection. At the junction of the descending and sigmoid colon, the colon was divided with linear cutting stapler. I ligation was performed of the mesenteric vessels using ties and the LigaSure. Using LigaSure then mobilized the mesial rectum laterally and posteriorly until I was 3-4 cm below the tumor. Anterior peritoneal attachments were then divided. I then divided the rectum approximately 3 cm to 4 cm distal to the tumor with linear cutting stapler. The proximal aspect of the rectum and sigmoid colon specimen was marked. This was sent to pathology for gross inspection. The tumor was at least 2 cm from the distal margin.  A size 29 EEA anastomosis was planned. The staple line from the descending colon was removed and the anvil was placed in the descending colon. The descending  colon was then sealed once again with the linear cutting stapler. The anvil was brought out through a tinea laterally and secured with a 2-0 Prolene suture. The handle of the staples and passed in the anus. An anterior rectum to side descending colon anastomosis was then performed with the EEA stapler. 2  solid donuts were noted. The distal-most doughnut was sent for the distal rectal margin. Air leak test was performed and there was no evidence of air leak. The anastomosis was patent, viable, and under no tension.  Irrigation was performed and hemostasis was adequate. A 19 Blake drain was then placed in the pelvis and brought out the left lower quadrant trocar incision. It was anchored to the skin with 3-0 nylon suture. The abdominal cavity was copiously irrigated. Fluids evacuated was clear. There is no evidence of bleeding or organ injury. The peritoneum of the extraction site incision was then approximated with running 0 Vicryl suture. Fascia was closed with running double-stranded #1 PDS suture. Repeat laparoscopy was performed. Fascial closure was solid. 4 quadrant inspection demonstrated no evidence of bleeding or organ injury. The trocars were removed and the CO2 gas was released.  The skin incisions at each trocar site were closed with 4-0 Monocryl subcuticular stitches followed by Steri-Strips and sterile dressings. The limited lower midline incision skin was closed with staples followed by sterile dressing.  She tolerated the procedure well without any apparent complications and was taken to the recovery room in satisfactory condition.   Estimated Blood Loss:  300 mL         Drains: #19 Blake drain in pelvis                Specimens: sigmoid colon and part of rectum        Complications:  * No complications entered in OR log *         Disposition: PACU - hemodynamically stable.         Condition: stable

## 2015-09-13 NOTE — Progress Notes (Signed)
Patient states when she voided this am her urine was blood tinged

## 2015-09-13 NOTE — Anesthesia Preprocedure Evaluation (Addendum)
Anesthesia Evaluation  Patient identified by MRN, date of birth, ID band Patient awake    Reviewed: Allergy & Precautions, H&P , NPO status , Patient's Chart, lab work & pertinent test results  Airway Mallampati: II  TM Distance: >3 FB Neck ROM: full    Dental no notable dental hx. (+) Dental Advisory Given, Teeth Intact   Pulmonary neg pulmonary ROS,    Pulmonary exam normal breath sounds clear to auscultation       Cardiovascular Exercise Tolerance: Good negative cardio ROS Normal cardiovascular exam Rhythm:regular Rate:Normal  RBBB   Neuro/Psych negative neurological ROS  negative psych ROS   GI/Hepatic negative GI ROS, Neg liver ROS,   Endo/Other  negative endocrine ROS  Renal/GU negative Renal ROS  negative genitourinary   Musculoskeletal   Abdominal   Peds  Hematology negative hematology ROS (+)   Anesthesia Other Findings   Reproductive/Obstetrics negative OB ROS                            Anesthesia Physical Anesthesia Plan  ASA: II  Anesthesia Plan: General   Post-op Pain Management:    Induction: Intravenous  Airway Management Planned: Oral ETT  Additional Equipment:   Intra-op Plan:   Post-operative Plan: Extubation in OR  Informed Consent: I have reviewed the patients History and Physical, chart, labs and discussed the procedure including the risks, benefits and alternatives for the proposed anesthesia with the patient or authorized representative who has indicated his/her understanding and acceptance.   Dental Advisory Given  Plan Discussed with: CRNA and Surgeon  Anesthesia Plan Comments:         Anesthesia Quick Evaluation

## 2015-09-13 NOTE — Anesthesia Procedure Notes (Signed)
Procedure Name: Intubation Date/Time: 09/13/2015 11:25 AM Performed by: Lissa Morales Pre-anesthesia Checklist: Patient identified, Emergency Drugs available, Suction available and Patient being monitored Patient Re-evaluated:Patient Re-evaluated prior to inductionOxygen Delivery Method: Circle System Utilized Preoxygenation: Pre-oxygenation with 100% oxygen Intubation Type: IV induction Ventilation: Mask ventilation without difficulty Grade View: Grade I Tube type: Oral Tube size: 7.5 mm Number of attempts: 1 Airway Equipment and Method: Stylet and Oral airway Placement Confirmation: ETT inserted through vocal cords under direct vision,  positive ETCO2 and breath sounds checked- equal and bilateral Secured at: 20 cm Tube secured with: Tape Dental Injury: Teeth and Oropharynx as per pre-operative assessment

## 2015-09-13 NOTE — Anesthesia Postprocedure Evaluation (Signed)
  Anesthesia Post-op Note  Patient: Terri Salazar  Procedure(s) Performed: Procedure(s) (LRB): LAPAROSCOPIC LOW ANTERIOR RESECTION OF RECTUM WITH MOBILIZATION OF SPLENIC FLEXURE (N/A)  Patient Location: PACU  Anesthesia Type: General  Level of Consciousness: awake and alert   Airway and Oxygen Therapy: Patient Spontanous Breathing  Post-op Pain: mild  Post-op Assessment: Post-op Vital signs reviewed, Patient's Cardiovascular Status Stable, Respiratory Function Stable, Patent Airway and No signs of Nausea or vomiting  Last Vitals:  Filed Vitals:   09/13/15 1930  BP: 107/45  Pulse: 66  Temp: 36.8 C  Resp: 16    Post-op Vital Signs: stable   Complications: No apparent anesthesia complications

## 2015-09-13 NOTE — Progress Notes (Addendum)
Title of Study: PATHOLOGY PROCUREMENT   °Description: Procurement of Human Biospecimens for the Discovery and Validation of Biomarkers for the Prediction, Diagnosis and Management of Disease   °Principal Investigator: Joshua Kish, MD office [phone] °Study Coordinator: Irene Varney cell [phone] °IRB #: [id number] ° °Met with Ms. Poblano for 10 minutes to review IRB# [id number]. No family members present. The patient is eligible and qualifies for this study. Reviewed the consent/HIPAA form in detail and explained the purpose of the study, study procedures, potential risks, potential benefits, and alternatives to participation. All of the patient's questions were answered. The patient agreed to take part in the study and signed the consent/HIPAA document. Enrollment procedures completed. The patient was given a copy of the signed informed consent. Consent form loaded in Media Tab (09/13/15 Procedure Note - [email or web address]) ° °--Request left with OR Controller desk to please deliver surgical specimen to Histology [facility], no formalin. °--Samples were taken for research purposes. Blood drawn 2 tubes x10mls each  °--NO Leftover tissue obtained by PA for research purposes, insufficient. Submitted entirely for diagnostic evaluation.  ° °Coordinators: °Irene Varney °[phone] °[place]  °[phone] °

## 2015-09-13 NOTE — H&P (View-Only) (Signed)
Terri Salazar 08/31/2015 10:24 AM Location: [facility] Patient #: [id number] DOB: 05-09-1961 Married / Language: Terri Salazar / Race: White Female  History of Present Illness Terri Hollingshead MD; 08/31/2015 11:31 AM) The patient is a 54 year old female.   Note:She is referred by Dr. 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 Her husband is with her. She saw Dr. Collene Mares because she was having some fecal urgency at times area also had some rectal bleeding. During the summer, she had a couple episodes of fecal incontinence while running or walking on hot days. As the weather has cooled down, she's not had this problem. Colonoscopy demonstrated a lesion at the rectosigmoid junction. Biopsy was performed and the area was marked. 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 CT of the chest and abdomen did not demonstrate any obvious evidence of metastatic disease. No family history of colorectal cancer. No unintended weight loss. No abdominal pain or pelvic pain.  Other Problems Elbert Ewings, CMA; 08/31/2015 10:24 AM) Back Pain Hemorrhoids Rectal Cancer  Past Surgical History Elbert Ewings, CMA; 08/31/2015 10:24 AM) Colon Polyp Removal - Colonoscopy Hysterectomy (not due to cancer) - Partial Oral Surgery  Diagnostic Studies History Elbert Ewings, CMA; 08/31/2015 10:24 AM) Colonoscopy >10 years ago Mammogram >3 years ago Pap Smear >5 years ago  Allergies Elbert Ewings, CMA; 08/31/2015 10:24 AM) No Known Drug Allergies 08/31/2015  Medication History Elbert Ewings, CMA; 08/31/2015 10:25 AM) Glucosamine (500MG  Capsule, Oral) Active. Vitamin D3 Active. Multiple Vitamin (Oral) Active. Medications Reconciled  Social History Elbert Ewings, [place]; 08/31/2015 10:24 AM) Alcohol use Occasional alcohol use. Caffeine use Carbonated beverages, Coffee, Tea. No drug use Tobacco use Never smoker.  Family History Elbert Ewings, [place];  08/31/2015 10:24 AM) Alcohol Abuse Family Members In General. Arthritis Father, Mother. Bleeding disorder Family Members In General. Depression Family Members In General. Melanoma Father. Migraine Headache Mother, Sister. Thyroid problems Mother.  Pregnancy / Birth History Elbert Ewings, [place]; 08/31/2015 10:24 AM) Age at menarche 69 years. Age of menopause 51-55 Contraceptive History Oral contraceptives. Gravida 1 Irregular periods Maternal age 2-40 Para 1     Review of Systems Elbert Ewings CMA; 08/31/2015 10:24 AM) General Present- Fatigue and Night Sweats. Not Present- Appetite Loss, Chills, Fever, Weight Gain and Weight Loss. Skin Not Present- Change in Wart/Mole, Dryness, Hives, Jaundice, New Lesions, Non-Healing Wounds, Rash and Ulcer. HEENT Present- Seasonal Allergies, Sinus Pain, Sore Throat and Wears glasses/contact lenses. Not Present- Earache, Hearing Loss, Hoarseness, Nose Bleed, Oral Ulcers, Ringing in the Ears, Visual Disturbances and Yellow Eyes. Respiratory Not Present- Bloody sputum, Chronic Cough, Difficulty Breathing, Snoring and Wheezing. Breast Not Present- Breast Mass, Breast Pain, Nipple Discharge and Skin Changes. Cardiovascular Not Present- Chest Pain, Difficulty Breathing Lying Down, Leg Cramps, Palpitations, Rapid Heart Rate, Shortness of Breath and Swelling of Extremities. Gastrointestinal Present- Bloating, Change in Bowel Habits, Excessive gas, Nausea and Rectal Pain. Not Present- Abdominal Pain, Bloody Stool, Chronic diarrhea, Constipation, Difficulty Swallowing, Gets full quickly at meals, Hemorrhoids, Indigestion and Vomiting. Female Genitourinary Not Present- Frequency, Nocturia, Painful Urination, Pelvic Pain and Urgency. Musculoskeletal Present- Back Pain, Joint Pain and Joint Stiffness. Not Present- Muscle Pain, Muscle Weakness and Swelling of Extremities. Neurological Present- Headaches. Not Present- Decreased Memory, Fainting,  Numbness, Seizures, Tingling, Tremor, Trouble walking and Weakness. Psychiatric Present- Anxiety and Fearful. Not Present- Bipolar, Change in Sleep Pattern, Depression and Frequent crying. Endocrine Present- Hot flashes. Not Present- Cold Intolerance, Excessive Hunger, Hair Changes, Heat Intolerance and New  Diabetes. Hematology Not Present- Easy Bruising, Excessive bleeding, Gland problems, HIV and Persistent Infections.  Vitals Elbert Ewings CMA; 08/31/2015 10:25 AM) 08/31/2015 10:25 AM Weight: 126.4 lb Height: 68in Body Surface Area: 1.68 m Body Mass Index: 19.22 kg/m  Temp.: 97.57F(Temporal)  Pulse: 74 (Regular)  BP: 122/78 (Sitting, Left Arm, Standard)      Physical Exam Terri Hollingshead MD; 08/31/2015 11:34 AM)  The physical exam findings are as follows: Note:General: Thin female in NAD. Pleasant and cooperative.  HEENT: [place]/AT, no facial masses  EYES: EOMI, no icterus  NECK: Supple, no obvious mass or thyroid enlargement.  CV: RRR, no murmur, no JVD.  CHEST: Breath sounds equal and clear. Respirations nonlabored.  ABDOMEN: Soft, nontender, nondistended, no masses, no organomegaly, active bowel sounds, small lower abdominal scars, no hernias.  ANORECTAL: No fissures. Slightly decreased sphincter tone. No masses.  MUSCULOSKELETAL: FROM, good muscle tone, no edema, no venous stasis changes  LYMPHATIC: No palpable cervical or inguinal adenopathy.  SKIN: No jaundice or suspicious rashes.  NEUROLOGIC: Alert and oriented, answers questions appropriately, normal gait and station.  PSYCHIATRIC: Normal mood, affect , and behavior.    Assessment & Plan Terri Hollingshead MD; 08/31/2015 11:06 AM)  CANCER OF RECTOSIGMOID (COLON) (C19) Impression: No evidence of metastatic disease on CT of chest or abdomen.  Plan: Laparoscopic-assisted low anterior resection. I have explained the procedure and risks of colon resection. Risks include but are not limited to  bleeding, infection, wound problems, anesthesia, anastomotic leak, need for colostomy, need for reoperative surgery, injury to intraabominal organs (such as intestine, spleen, kidney, bladder, ureter, etc.), ileus, irregular bowel habits. She seems to understand and agrees to proceed.  FECAL SOILING DUE TO FECAL INCONTINENCE (R15.9) Impression: This occurred when she was running and walking only during the summer. It has not occurred recently as weather has cooled down. She does have decreased sphincter tone on exam.  Plan: Pelvic floor exercises and referral for pelvic floor PT preop. Would not do a sphincteroplasty at the same time as I don't think this is indicated in her current state.  Jackolyn Confer, MD

## 2015-09-13 NOTE — Anesthesia Postprocedure Evaluation (Signed)
  Anesthesia Post-op Note  Patient: Terri Salazar  Procedure(s) Performed: Procedure(s): LAPAROSCOPIC LOW ANTERIOR RESECTION OF RECTUM WITH MOBILIZATION OF SPLENIC FLEXURE (N/A)  Patient Location: PACU  Anesthesia Type:General  Level of Consciousness: awake and alert   Airway and Oxygen Therapy: Patient Spontanous Breathing  Post-op Pain: Controlled  Post-op Assessment: Post-op Vital signs reviewed, Patient's Cardiovascular Status Stable and Respiratory Function Stable  Post-op Vital Signs: Reviewed  Filed Vitals:   09/13/15 1629  BP: 122/76  Pulse: 62  Temp: 36.6 C  Resp: 24    Complications: No apparent anesthesia complications

## 2015-09-13 NOTE — Transfer of Care (Signed)
Immediate Anesthesia Transfer of Care Note  Patient: Terri Salazar  Procedure(s) Performed: Procedure(s): LAPAROSCOPIC LOW ANTERIOR RESECTION OF RECTUM WITH MOBILIZATION OF SPLENIC FLEXURE (N/A)  Patient Location: PACU  Anesthesia Type:General  Level of Consciousness:  sedated, patient cooperative and responds to stimulation  Airway & Oxygen Therapy:Patient Spontanous Breathing and Patient connected to face mask oxgen  Post-op Assessment:  Report given to PACU RN and Post -op Vital signs reviewed and stable  Post vital signs:  Reviewed and stable  Last Vitals:  Filed Vitals:   09/13/15 1515  BP: 148/86  Pulse: 93  Temp: 36.7 C  Resp: 19    Complications: No apparent anesthesia complications

## 2015-09-14 LAB — BASIC METABOLIC PANEL
Anion gap: 6 (ref 5–15)
BUN: 10 mg/dL (ref 6–20)
CHLORIDE: 108 mmol/L (ref 101–111)
CO2: 27 mmol/L (ref 22–32)
Calcium: 8.3 mg/dL — ABNORMAL LOW (ref 8.9–10.3)
Creatinine, Ser: 0.71 mg/dL (ref 0.44–1.00)
GFR calc Af Amer: >60 mL/min (ref >60)
GFR calc non Af Amer: >60 mL/min (ref >60)
GLUCOSE: 146 mg/dL — AB (ref 65–99)
POTASSIUM: 4.2 mmol/L (ref 3.5–5.1)
Sodium: 141 mmol/L (ref 135–145)

## 2015-09-14 LAB — CBC
HEMATOCRIT: 33.1 % — AB (ref 36.0–46.0)
Hemoglobin: 11.1 g/dL — ABNORMAL LOW (ref 12.0–15.0)
MCH: 30.7 pg (ref 26.0–34.0)
MCHC: 33.5 g/dL (ref 30.0–36.0)
MCV: 91.7 fL (ref 78.0–100.0)
Platelets: 229 10*3/uL (ref 150–400)
RBC: 3.61 MIL/uL — ABNORMAL LOW (ref 3.87–5.11)
RDW: 13 % (ref 11.5–15.5)
WBC: 13.2 10*3/uL — ABNORMAL HIGH (ref 4.0–10.5)

## 2015-09-14 MED ORDER — KETOROLAC TROMETHAMINE 30 MG/ML IJ SOLN
30.0000 mg | Freq: Four times a day (QID) | INTRAMUSCULAR | Status: AC
  Administered 2015-09-14 – 2015-09-15 (×6): 30 mg via INTRAVENOUS
  Filled 2015-09-14 (×7): qty 1

## 2015-09-14 MED ORDER — MORPHINE SULFATE (PF) 2 MG/ML IV SOLN
1.0000 mg | INTRAVENOUS | Status: DC | PRN
  Administered 2015-09-14 (×2): 2 mg via INTRAVENOUS
  Filled 2015-09-14 (×2): qty 1

## 2015-09-14 NOTE — Progress Notes (Signed)
Pt's low respiratory rate (about 10 per minute), was causing the PCA to alarm.  Notified MD on call, and told me to turn the PCA off, and give Morphine IV as needed for pain.  Will continue to monitor.

## 2015-09-14 NOTE — Progress Notes (Signed)
1 Day Post-Op  Subjective: Some incision soreness.  Some sedation on full dose Morphine PCA.  No nausea. Mother in room.  Objective: Vital signs in last 24 hours: Temp:  [97.6 F (36.4 C)-99 F (37.2 C)] 98.5 F (36.9 C) (11/01 0630) Pulse Rate:  [51-93] 66 (11/01 0630) Resp:  [9-24] 16 (11/01 0630) BP: (90-148)/(38-87) 90/44 mmHg (11/01 0630) SpO2:  [92 %-100 %] 99 % (11/01 0630) Weight:  [58.145 kg (128 lb 3 oz)] 58.145 kg (128 lb 3 oz) (10/31 0347)    Intake/Output from previous day: 10/31 0701 - 11/01 0700 In: 5368.8 [I.V.:5018.8; IV Piggyback:350] Out: 2015 [Urine:1725; Drains:190; Blood:100] Intake/Output this shift:    PE: General- In NAD Abdomen-soft, dressings dry, serosanguinous drain output, few bowel sounds.  Lab Results:   Recent Labs  09/14/15 0520  WBC 13.2*  HGB 11.1*  HCT 33.1*  PLT 229   BMET  Recent Labs  09/14/15 0520  NA 141  K 4.2  CL 108  CO2 27  GLUCOSE 146*  BUN 10  CREATININE 0.71  CALCIUM 8.3*   PT/INR No results for input(s): LABPROT, INR in the last 72 hours. Comprehensive Metabolic Panel:    Component Value Date/Time   NA 141 09/14/2015 0520   NA 141 09/10/2015 1135   K 4.2 09/14/2015 0520   K 4.6 09/10/2015 1135   CL 108 09/14/2015 0520   CL 106 09/10/2015 1135   CO2 27 09/14/2015 0520   CO2 27 09/10/2015 1135   BUN 10 09/14/2015 0520   BUN 15 09/10/2015 1135   CREATININE 0.71 09/14/2015 0520   CREATININE 0.72 09/10/2015 1135   GLUCOSE 146* 09/14/2015 0520   GLUCOSE 99 09/10/2015 1135   CALCIUM 8.3* 09/14/2015 0520   CALCIUM 9.6 09/10/2015 1135   AST 20 09/10/2015 1135   AST 21 02/11/2010 0930   ALT 18 09/10/2015 1135   ALT 20 02/11/2010 0930   ALKPHOS 61 09/10/2015 1135   ALKPHOS 36* 02/11/2010 0930   BILITOT 0.5 09/10/2015 1135   BILITOT 0.9 02/11/2010 0930   PROT 7.1 09/10/2015 1135   PROT 7.0 02/11/2010 0930   ALBUMIN 4.6 09/10/2015 1135   ALBUMIN 4.3 02/11/2010 0930     Studies/Results: No  results found.  Anti-infectives: Anti-infectives    Start     Dose/Rate Route Frequency Ordered Stop   09/13/15 2200  cefoTEtan (CEFOTAN) 2 g in dextrose 5 % 50 mL IVPB     2 g 100 mL/hr over 30 Minutes Intravenous Every 12 hours 09/13/15 1641 09/13/15 2246   09/13/15 0807  cefoTEtan (CEFOTAN) 2 g in dextrose 5 % 50 mL IVPB     2 g 100 mL/hr over 30 Minutes Intravenous On call to O.R. 09/13/15 0807 09/13/15 1118      Assessment Principal Problem:   Rectal cancer ([place]) s/p lap assisted LAR 09/13/15-some sedation from PCA otherwise doing okay.    LOS: 1 day   Plan: Change to reduced dose PCA.  Add Toradol.  Clear liquids. OOB.   Avina Eberle J 09/14/2015

## 2015-09-15 MED ORDER — HYDROMORPHONE HCL 1 MG/ML IJ SOLN
0.5000 mg | INTRAMUSCULAR | Status: DC | PRN
  Administered 2015-09-15 – 2015-09-17 (×4): 1 mg via INTRAVENOUS
  Filled 2015-09-15 (×5): qty 1

## 2015-09-15 MED ORDER — METHOCARBAMOL 500 MG PO TABS
500.0000 mg | ORAL_TABLET | Freq: Four times a day (QID) | ORAL | Status: DC | PRN
  Administered 2015-09-15 – 2015-09-17 (×6): 500 mg via ORAL
  Filled 2015-09-15 (×6): qty 1

## 2015-09-15 MED ORDER — HYDROMORPHONE HCL 1 MG/ML IJ SOLN
1.0000 mg | Freq: Once | INTRAMUSCULAR | Status: AC
  Administered 2015-09-15: 1 mg via INTRAVENOUS
  Filled 2015-09-15: qty 1

## 2015-09-15 NOTE — Progress Notes (Signed)
2 Days Post-Op  Subjective: Morphine did not work well.  Off PCA.  On Toradol and Dilaudid.  No flatus.  Walking.  Voiding.  Tolerating clear liquids. No nausea.  Objective: Vital signs in last 24 hours: Temp:  [98.3 F (36.8 C)-98.7 F (37.1 C)] 98.6 F (37 C) (11/02 0800) Pulse Rate:  [61-67] 61 (11/02 0800) Resp:  [16-18] 16 (11/02 0800) BP: (93-126)/(53-75) 121/75 mmHg (11/02 0800) SpO2:  [93 %-100 %] 99 % (11/02 0800)    Intake/Output from previous day: 11/01 0701 - 11/02 0700 In: 2593.3 [P.O.:200; I.V.:2393.3] Out: 2410 [Urine:2350; Drains:60] Intake/Output this shift: Total I/O In: 400 [P.O.:200; Other:200] Out: 30 [Drains:30]  PE: General- In NAD Abdomen-soft, flat, serous drainage around drain site, serous drain output  Lab Results:   Recent Labs  09/14/15 0520  WBC 13.2*  HGB 11.1*  HCT 33.1*  PLT 229   BMET  Recent Labs  09/14/15 0520  NA 141  K 4.2  CL 108  CO2 27  GLUCOSE 146*  BUN 10  CREATININE 0.71  CALCIUM 8.3*   PT/INR No results for input(s): LABPROT, INR in the last 72 hours. Comprehensive Metabolic Panel:    Component Value Date/Time   NA 141 09/14/2015 0520   NA 141 09/10/2015 1135   K 4.2 09/14/2015 0520   K 4.6 09/10/2015 1135   CL 108 09/14/2015 0520   CL 106 09/10/2015 1135   CO2 27 09/14/2015 0520   CO2 27 09/10/2015 1135   BUN 10 09/14/2015 0520   BUN 15 09/10/2015 1135   CREATININE 0.71 09/14/2015 0520   CREATININE 0.72 09/10/2015 1135   GLUCOSE 146* 09/14/2015 0520   GLUCOSE 99 09/10/2015 1135   CALCIUM 8.3* 09/14/2015 0520   CALCIUM 9.6 09/10/2015 1135   AST 20 09/10/2015 1135   AST 21 02/11/2010 0930   ALT 18 09/10/2015 1135   ALT 20 02/11/2010 0930   ALKPHOS 61 09/10/2015 1135   ALKPHOS 36* 02/11/2010 0930   BILITOT 0.5 09/10/2015 1135   BILITOT 0.9 02/11/2010 0930   PROT 7.1 09/10/2015 1135   PROT 7.0 02/11/2010 0930   ALBUMIN 4.6 09/10/2015 1135   ALBUMIN 4.3 02/11/2010 0930      Studies/Results: No results found.  Anti-infectives: Anti-infectives    Start     Dose/Rate Route Frequency Ordered Stop   09/13/15 2200  cefoTEtan (CEFOTAN) 2 g in dextrose 5 % 50 mL IVPB     2 g 100 mL/hr over 30 Minutes Intravenous Every 12 hours 09/13/15 1641 09/13/15 2246   09/13/15 0807  cefoTEtan (CEFOTAN) 2 g in dextrose 5 % 50 mL IVPB     2 g 100 mL/hr over 30 Minutes Intravenous On call to O.R. 09/13/15 0807 09/13/15 1118      Assessment Principal Problem:   Rectal cancer ([place]) s/p lap assisted LAR 09/13/15-tolerating liquids; bowel function has not returned yet.    LOS: 2 days   Plan: Full liquids.  Decrease IVF.  Robaxin prn.   Jaxn Chiquito J 09/15/2015

## 2015-09-15 NOTE — Progress Notes (Signed)
Patient experienced a lot of pain at the site of her JP.  She feels she might have hurt it by walking yesterday.  The site has some serosanguinous drainage coming around the suture site. Dressing changed round it.  She felt fine afterwards, But was tensed up for a long time.  Had some difficulty alleviating pain after toradol, Called and received order for 1 mg Dilaudid IV once for her.   Roland Rack. RN.

## 2015-09-16 MED ORDER — OXYCODONE HCL 5 MG PO TABS
5.0000 mg | ORAL_TABLET | ORAL | Status: DC | PRN
  Administered 2015-09-16 – 2015-09-17 (×4): 5 mg via ORAL
  Filled 2015-09-16 (×5): qty 1

## 2015-09-16 NOTE — Progress Notes (Signed)
3 Days Post-Op  Subjective: Passing some gas.  Tolerating full liquids.  Objective: Vital signs in last 24 hours: Temp:  [98.2 F (36.8 C)-98.6 F (37 C)] 98.6 F (37 C) (11/03 0605) Pulse Rate:  [52-75] 62 (11/03 0605) Resp:  [16-18] 16 (11/03 0605) BP: (114-119)/(65-71) 115/69 mmHg (11/03 0605) SpO2:  [90 %-99 %] 96 % (11/03 0605)    Intake/Output from previous day: 11/02 0701 - 11/03 0700 In: 1650.8 [P.O.:200; I.V.:1450.8] Out: 1985 [Urine:1875; Drains:110] Intake/Output this shift:    PE: General- In NAD Abdomen-incisions are clean and intact, serous drain output  Lab Results:   Recent Labs  09/14/15 0520  WBC 13.2*  HGB 11.1*  HCT 33.1*  PLT 229   BMET  Recent Labs  09/14/15 0520  NA 141  K 4.2  CL 108  CO2 27  GLUCOSE 146*  BUN 10  CREATININE 0.71  CALCIUM 8.3*   PT/INR No results for input(s): LABPROT, INR in the last 72 hours. Comprehensive Metabolic Panel:    Component Value Date/Time   NA 141 09/14/2015 0520   NA 141 09/10/2015 1135   K 4.2 09/14/2015 0520   K 4.6 09/10/2015 1135   CL 108 09/14/2015 0520   CL 106 09/10/2015 1135   CO2 27 09/14/2015 0520   CO2 27 09/10/2015 1135   BUN 10 09/14/2015 0520   BUN 15 09/10/2015 1135   CREATININE 0.71 09/14/2015 0520   CREATININE 0.72 09/10/2015 1135   GLUCOSE 146* 09/14/2015 0520   GLUCOSE 99 09/10/2015 1135   CALCIUM 8.3* 09/14/2015 0520   CALCIUM 9.6 09/10/2015 1135   AST 20 09/10/2015 1135   AST 21 02/11/2010 0930   ALT 18 09/10/2015 1135   ALT 20 02/11/2010 0930   ALKPHOS 61 09/10/2015 1135   ALKPHOS 36* 02/11/2010 0930   BILITOT 0.5 09/10/2015 1135   BILITOT 0.9 02/11/2010 0930   PROT 7.1 09/10/2015 1135   PROT 7.0 02/11/2010 0930   ALBUMIN 4.6 09/10/2015 1135   ALBUMIN 4.3 02/11/2010 0930     Studies/Results: No results found.  Anti-infectives: Anti-infectives    Start     Dose/Rate Route Frequency Ordered Stop   09/13/15 2200  cefoTEtan (CEFOTAN) 2 g in dextrose 5  % 50 mL IVPB     2 g 100 mL/hr over 30 Minutes Intravenous Every 12 hours 09/13/15 1641 09/13/15 2246   09/13/15 0807  cefoTEtan (CEFOTAN) 2 g in dextrose 5 % 50 mL IVPB     2 g 100 mL/hr over 30 Minutes Intravenous On call to O.R. 09/13/15 0807 09/13/15 1118      Assessment Principal Problem:   Rectal cancer ([place]) s/p lap assisted LAR 09/13/15-progressing well; path T1N0-Stage 1, discussed with her.    LOS: 3 days   Plan: Advance to solid diet.  Oral analgesic.  Heplock IV.   Raelie Lohr J 09/16/2015

## 2015-09-17 MED ORDER — OXYCODONE HCL 5 MG PO TABS: 5.0000 mg | ORAL_TABLET | ORAL | Status: DC | PRN

## 2015-09-17 NOTE — Progress Notes (Signed)
4 Days Post-Op  Subjective: Tolerating diet.  Bowels moving.  Pain well-controlled.  Objective: Vital signs in last 24 hours: Temp:  [98.1 F (36.7 C)-98.6 F (37 C)] 98.6 F (37 C) (11/04 0542) Pulse Rate:  [60-76] 60 (11/04 0542) Resp:  [16-18] 18 (11/04 0542) BP: (106-124)/(59-71) 106/61 mmHg (11/04 0542) SpO2:  [99 %-100 %] 99 % (11/04 0542) Last BM Date: 09/17/15  Intake/Output from previous day: 11/03 0701 - 11/04 0700 In: 865.8 [P.O.:720; I.V.:145.8] Out: 145 [Drains:145] Intake/Output this shift:    PE: General- In NAD Abdomen-soft, incisions are clean and intact, drain removed  Lab Results:  No results for input(s): WBC, HGB, HCT, PLT in the last 72 hours. BMET No results for input(s): NA, K, CL, CO2, GLUCOSE, BUN, CREATININE, CALCIUM in the last 72 hours. PT/INR No results for input(s): LABPROT, INR in the last 72 hours. Comprehensive Metabolic Panel:    Component Value Date/Time   NA 141 09/14/2015 0520   NA 141 09/10/2015 1135   K 4.2 09/14/2015 0520   K 4.6 09/10/2015 1135   CL 108 09/14/2015 0520   CL 106 09/10/2015 1135   CO2 27 09/14/2015 0520   CO2 27 09/10/2015 1135   BUN 10 09/14/2015 0520   BUN 15 09/10/2015 1135   CREATININE 0.71 09/14/2015 0520   CREATININE 0.72 09/10/2015 1135   GLUCOSE 146* 09/14/2015 0520   GLUCOSE 99 09/10/2015 1135   CALCIUM 8.3* 09/14/2015 0520   CALCIUM 9.6 09/10/2015 1135   AST 20 09/10/2015 1135   AST 21 02/11/2010 0930   ALT 18 09/10/2015 1135   ALT 20 02/11/2010 0930   ALKPHOS 61 09/10/2015 1135   ALKPHOS 36* 02/11/2010 0930   BILITOT 0.5 09/10/2015 1135   BILITOT 0.9 02/11/2010 0930   PROT 7.1 09/10/2015 1135   PROT 7.0 02/11/2010 0930   ALBUMIN 4.6 09/10/2015 1135   ALBUMIN 4.3 02/11/2010 0930     Studies/Results: No results found.  Anti-infectives: Anti-infectives    Start     Dose/Rate Route Frequency Ordered Stop   09/13/15 2200  cefoTEtan (CEFOTAN) 2 g in dextrose 5 % 50 mL IVPB     2  g 100 mL/hr over 30 Minutes Intravenous Every 12 hours 09/13/15 1641 09/13/15 2246   09/13/15 0807  cefoTEtan (CEFOTAN) 2 g in dextrose 5 % 50 mL IVPB     2 g 100 mL/hr over 30 Minutes Intravenous On call to O.R. 09/13/15 0807 09/13/15 1118      Assessment Principal Problem:   Stage 1 rectal cancer ([place]) s/p lap assisted LAR 09/13/15-continues to progress well    LOS: 4 days   Plan: Discharge today.  Instructions given to her.   Maebelle Sulton J 09/17/2015

## 2015-09-17 NOTE — Progress Notes (Signed)
Discharge instructions discussed with Patient and mother until no further questions ask. Patient able to answer questions about bathing and when to call md. IV removed. JP drain has been removed by MD. Pt ambulating , pain controled on oral meds, tolerating diet, voiding and passing flatus. Pt desires discharge.

## 2015-09-17 NOTE — Discharge Summary (Signed)
Physician Discharge Summary  Patient ID: Terri Salazar MRN: [id number] DOB/AGE: [birth year] 54 y.o.  Admit date: 09/13/2015 Discharge date: 09/17/2015  Admission Diagnoses:  Rectosigmoid cancer  Discharge Diagnoses:  Principal Problem:   Stage I rectal cancer [facility]) s/p lap assisted LAR 09/13/15   Discharged Condition: good  Hospital Course: she was admitted and underwent the above procedure. Final pathology was consistent with stage I rectal cancer. She was placed on the postoperative colorectal surgery pathway and progressed well. By the fourth postoperative day she had her drain removed and was ready to be discharged. Discharge instructions were given to her. She will follow-up in the office in 3-5 days to have staples removed.   Discharge Exam: Blood pressure 106/61, pulse 60, temperature 98.6 F (37 C), temperature source Oral, resp. rate 18, height 5\' 8"  (1.727 m), weight 58.145 kg (128 lb 3 oz), SpO2 99 %.   Disposition: 01-Home or Self Care     Medication List    STOP taking these medications        metroNIDAZOLE 500 MG tablet  Commonly known as:  FLAGYL     neomycin 500 MG tablet  Commonly known as:  MYCIFRADIN     PEG-3350/Electrolytes 236 G Solr      TAKE these medications        acetaminophen 325 MG tablet  Commonly known as:  TYLENOL  Take by mouth every 6 (six) hours as needed for headache.     diphenhydrAMINE 25 mg capsule  Commonly known as:  BENADRYL  Take 25 mg by mouth at bedtime as needed for sleep.     MIRALAX PO  Take 17 g by mouth daily as needed (constipation).     MULTI FOR HER PO  Take 1 tablet by mouth daily.     oxyCODONE 5 MG immediate release tablet  Commonly known as:  Oxy IR/ROXICODONE  Take 1-2 tablets (5-10 mg total) by mouth every 4 (four) hours as needed for moderate pain.     pseudoephedrine 30 MG tablet  Commonly known as:  SUDAFED  Take 30 mg by mouth every 4 (four) hours as needed for congestion.     RA  GLUCOSAMINE-CHONDROITIN Tabs  Take 1 tablet by mouth daily.     Vitamin D 2000 UNITS Caps  Take 2,000 Units by mouth daily.         Signed: Odis Hollingshead 09/17/2015, 10:27 AM

## 2015-09-17 NOTE — Discharge Instructions (Signed)
Removal of the Colon (Colectomy) Care After  HOME CARE INSTRUCTIONS  Once home, an ice pack applied to the operative site may help with discomfort and keep swelling down.   Change dressings as directed.   Only take over-the-counter or prescription medicines for pain, discomfort, or fever as directed by your caregiver.   Soft diet.  Do not overeat.  Drink plenty of fluids.   Do not drive for at least 2-3 weeks and then only if you are no longer taking pain medicine.  There should be no heavy lifting (more than 10 pounds), strenuous activities or contact sports for at least 6 weeks, and then only after approval of your surgeon.   Keep the wound dry and clean. The wound may be washed gently with soap and water. Gently blot or dab dry following cleansing, without rubbing. Do not take baths, use swimming pools, or use hot tubs for 4 weeks, or as instructed by your caregivers.   Apply a dry dressing to the wounds daily as we did in the hospital  If you have a colostomy, care for it as you have been shown.   Call the office [phone]) for a followup appointment. SEEK MEDICAL CARE IF (call the office):   There is redness, swelling, or increasing pain in the wound area.   Pus or blood is coming from the wound.   An unexplained oral temperature above 101.5 degrees F develops.   You notice a foul smell coming from the wound or dressing.   There is a breaking open of a wound (edges not staying together) after the sutures have been removed.   There is increasing abdominal pain.   You have persistent vomiting. SEEK IMMEDIATE MEDICAL CARE IF:   A rash develops.   There is difficulty breathing, or development of a reaction or side effects to medications given.  Document Released: 05/30/2004 Document Revised: 07/12/2011 Document Reviewed: 12/03/2007 [facility] Patient Information 2012 [place].

## 2016-07-26 ENCOUNTER — Encounter: Payer: Self-pay | Admitting: Podiatry

## 2016-07-26 ENCOUNTER — Ambulatory Visit (INDEPENDENT_AMBULATORY_CARE_PROVIDER_SITE_OTHER): Payer: BLUE CROSS/BLUE SHIELD | Admitting: Podiatry

## 2016-07-26 ENCOUNTER — Ambulatory Visit (INDEPENDENT_AMBULATORY_CARE_PROVIDER_SITE_OTHER): Payer: BLUE CROSS/BLUE SHIELD

## 2016-07-26 DIAGNOSIS — M79672 Pain in left foot: Secondary | ICD-10-CM

## 2016-07-26 DIAGNOSIS — M79671 Pain in right foot: Secondary | ICD-10-CM | POA: Diagnosis not present

## 2016-07-26 DIAGNOSIS — M2042 Other hammer toe(s) (acquired), left foot: Secondary | ICD-10-CM

## 2016-07-26 DIAGNOSIS — M779 Enthesopathy, unspecified: Secondary | ICD-10-CM

## 2016-07-26 MED ORDER — TRIAMCINOLONE ACETONIDE 10 MG/ML IJ SUSP
10.0000 mg | Freq: Once | INTRAMUSCULAR | Status: AC
  Administered 2016-07-26: 10 mg

## 2016-07-27 NOTE — Progress Notes (Signed)
Subjective:     Patient ID: Terri Salazar, female   DOB: June 29, 1961, 55 y.o.   MRN: [id number]  HPI patient states that she's getting a lot of pain in her foot right over left and she's very active and walks 7-10 miles a day but states this is making increasingly hard for her to do. States it's been going on now for several months gradually getting worse   Review of Systems     Objective:   Physical Exam Neurovascular status intact with muscle strength adequate with patient noted to have inflammation and pain right third metatarsophalangeal joint and also on the left foot found to have distal keratotic lesion that's painful when pressed making shoe gear difficult at times    Assessment:     Inflammatory capsulitis right third MPJ with distal keratotic lesion left third digit    Plan:     H&P x-rays reviewed and today I did a proximal nerve block right I did discuss the possibilities for flexor plate damage or rupture and I then went ahead and aspirated the third MPJ getting out of small amount of fluid and injected corner cc dexamethasone Kenalog and applied thick plantar padding. Debrided lesion on the end of the third toe and this may need to be done occasionally  X-ray report was negative for signs of fracture or bony injury

## 2016-08-09 ENCOUNTER — Ambulatory Visit (INDEPENDENT_AMBULATORY_CARE_PROVIDER_SITE_OTHER): Payer: BLUE CROSS/BLUE SHIELD | Admitting: Podiatry

## 2016-08-09 ENCOUNTER — Encounter: Payer: Self-pay | Admitting: Podiatry

## 2016-08-09 DIAGNOSIS — M79672 Pain in left foot: Secondary | ICD-10-CM

## 2016-08-09 DIAGNOSIS — M79671 Pain in right foot: Secondary | ICD-10-CM | POA: Diagnosis not present

## 2016-08-09 DIAGNOSIS — M779 Enthesopathy, unspecified: Secondary | ICD-10-CM

## 2016-08-10 NOTE — Progress Notes (Signed)
Subjective:     Patient ID: Terri Salazar, female   DOB: February 02, 1961, 55 y.o.   MRN: [id number]  HPI patient presents stating it improved some but she still has pain and she has pain also in the left   Review of Systems     Objective:   Physical Exam  Neurovascular status intact muscle strength adequate with continued discomfort around the third MPJ right and left foot with fluid buildup. Patient states that it continues to be a problem for her    Assessment:     Inflammatory capsulitis third MPJ bilateral secondary to foot structure with depressed arch    Plan:     Reviewed condition discussing treatment options and at this point I recommended long-term orthotics with this bursal pads around the third MPJ and patient is scanned and will be seen back when ready. Explained that ultimately he may require further treatment but I'm hoping this will get it better

## 2016-08-31 ENCOUNTER — Ambulatory Visit (INDEPENDENT_AMBULATORY_CARE_PROVIDER_SITE_OTHER): Payer: BLUE CROSS/BLUE SHIELD | Admitting: Podiatry

## 2016-08-31 ENCOUNTER — Encounter: Payer: Self-pay | Admitting: Podiatry

## 2016-08-31 DIAGNOSIS — M79672 Pain in left foot: Secondary | ICD-10-CM

## 2016-08-31 DIAGNOSIS — M779 Enthesopathy, unspecified: Secondary | ICD-10-CM

## 2016-08-31 NOTE — Patient Instructions (Signed)

## 2016-09-04 NOTE — Progress Notes (Signed)
Subjective:     Patient ID: Terri Salazar, female   DOB: 1961/04/11, 55 y.o.   MRN: [id number]  HPI patient presents for orthotics   Review of Systems     Objective:   Physical Exam Neurovascular status intact no health changes noted    Assessment:     Stable condition    Plan:     Orthotics dispensed and will be seen back and gave instructions today

## 2016-10-23 IMAGING — CT CT ABD-PELV W/ CM
1 of 3 series · 13 of 32 positions shown, 18 images · IV contrast (OMNIPAQUE)
Comparison: None.

CLINICAL DATA: Newly diagnosed sigmoid colon carcinoma by
colonoscopy.

EXAM:
CT CHEST, ABDOMEN, AND PELVIS WITH CONTRAST
TECHNIQUE: Multidetector CT imaging of the chest, abdomen and pelvis was
performed following the standard protocol during bolus
administration of intravenous contrast.
CONTRAST:  100mL OMNIPAQUE IOHEXOL 300 MG/ML  SOLN

[Series 2: cap w · axial · 0.73mm/px · z∈[-634,-64]mm · 13 of 130 slices shown, 18 images]
[im 8/130  soft-tissue]
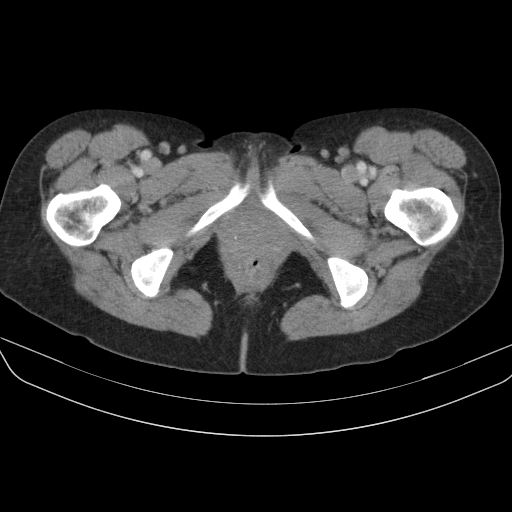
[im 8/130  bone]
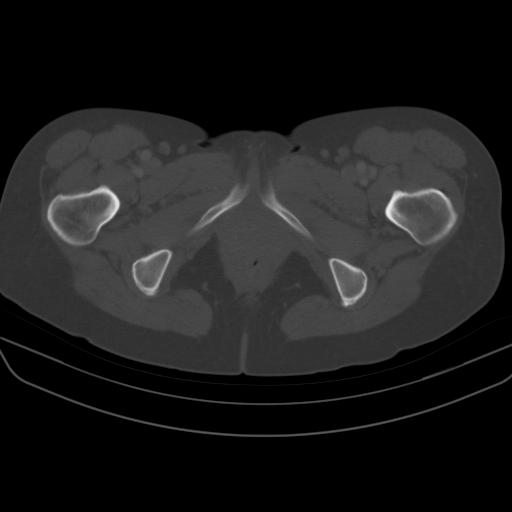
[im 23/130  soft-tissue]
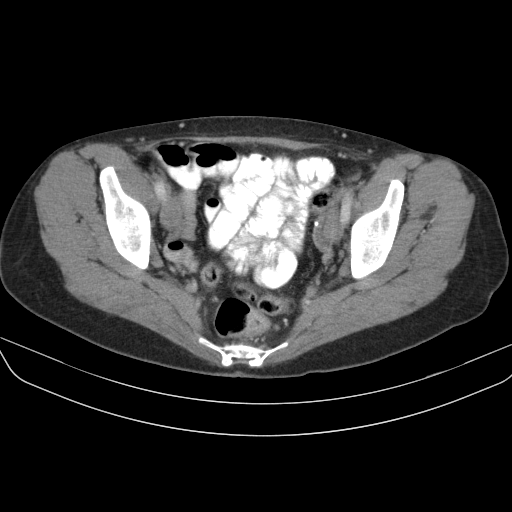
[im 31/130  soft-tissue]
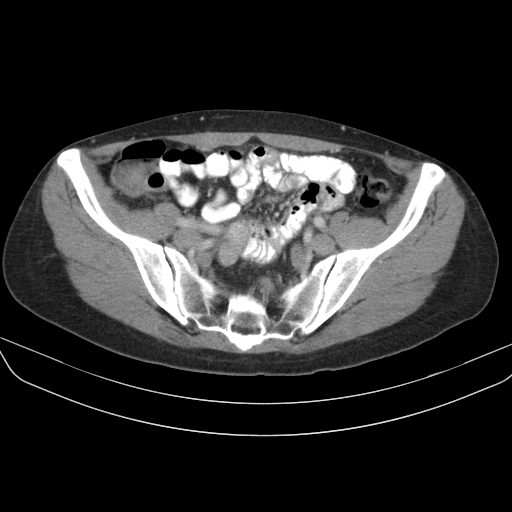
[im 38/130  soft-tissue]
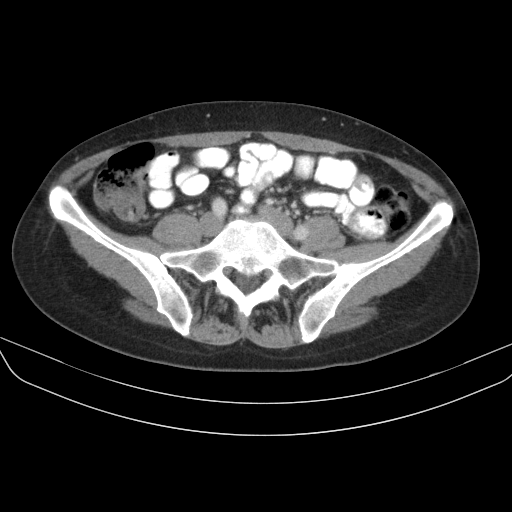
[im 54/130  soft-tissue]
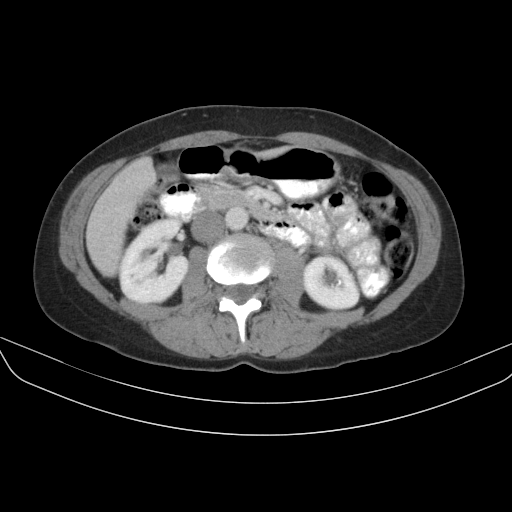
[im 61/130  soft-tissue]
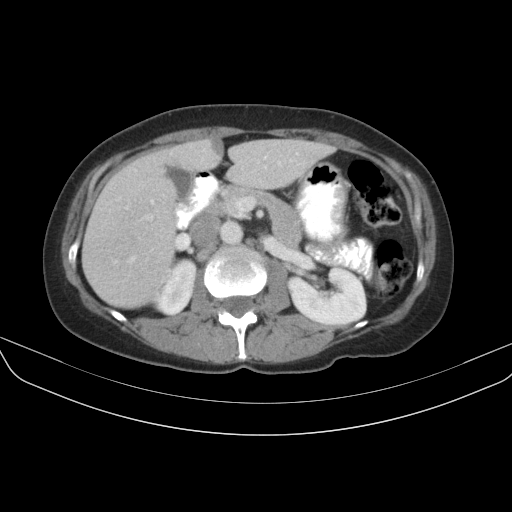
[im 69/130  soft-tissue]
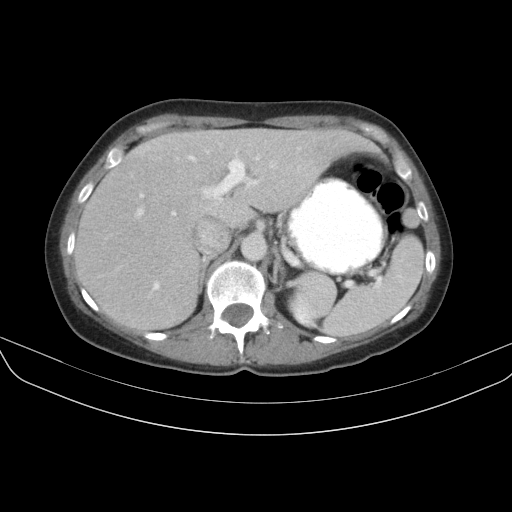
[im 84/130  soft-tissue]
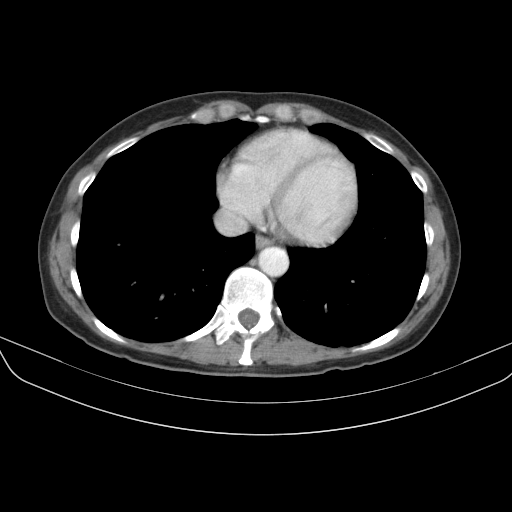
[im 92/130  soft-tissue]
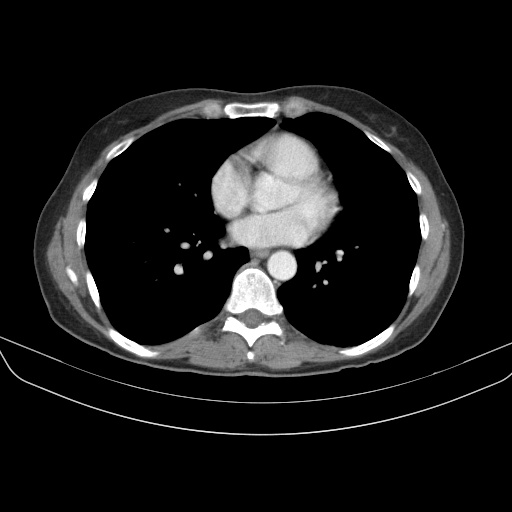
[im 92/130  bone]
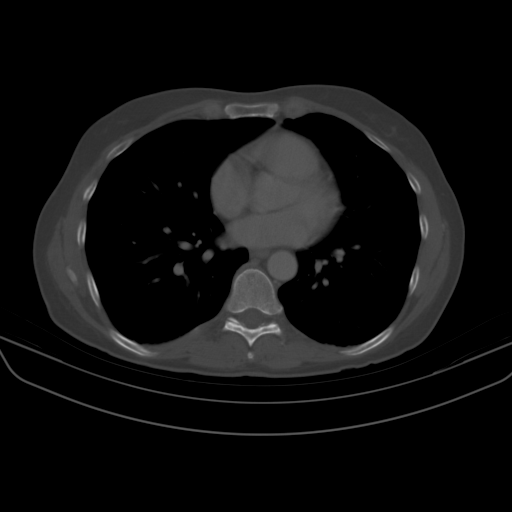
[im 99/130  soft-tissue]
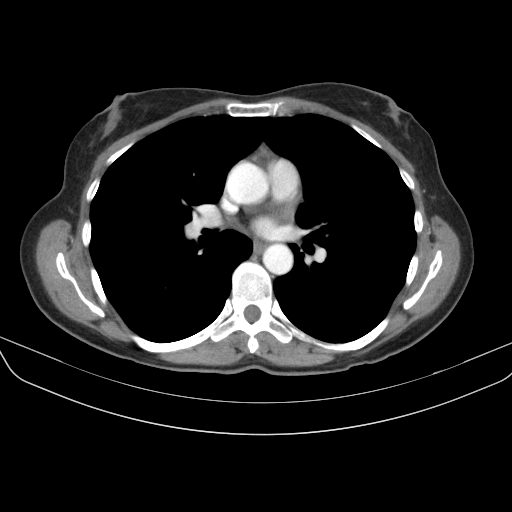
[im 99/130  lung]
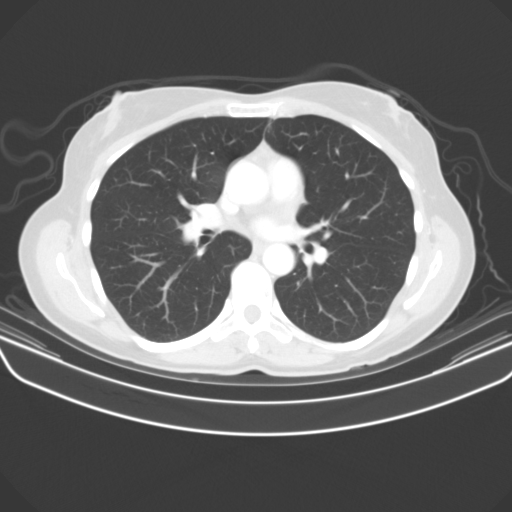
[im 107/130  lung]
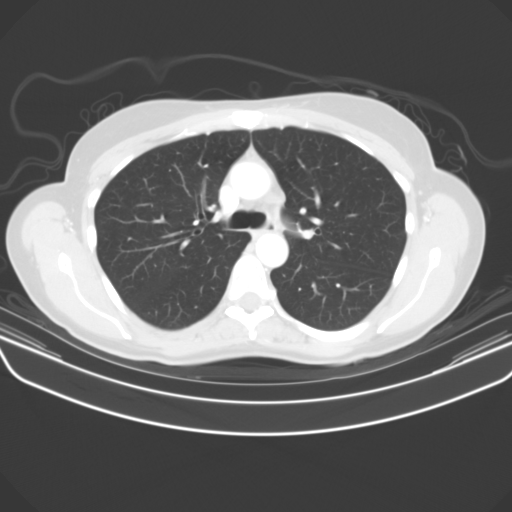
[im 114/130  soft-tissue]
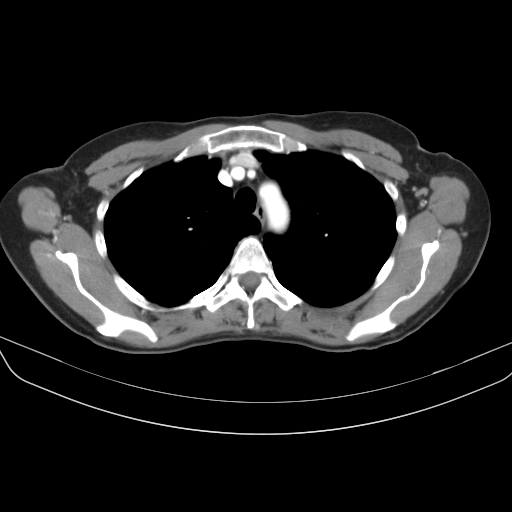
[im 114/130  lung]
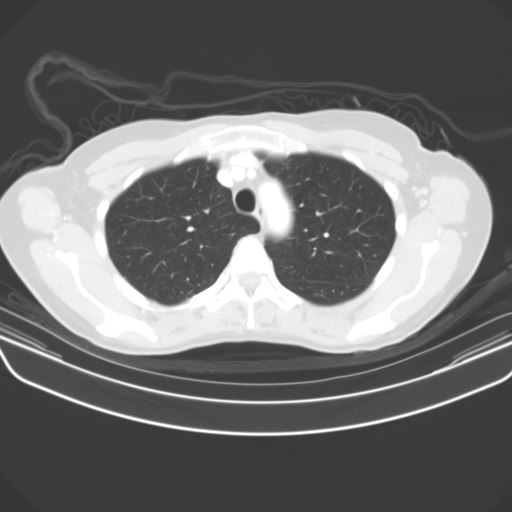
[im 122/130  soft-tissue]
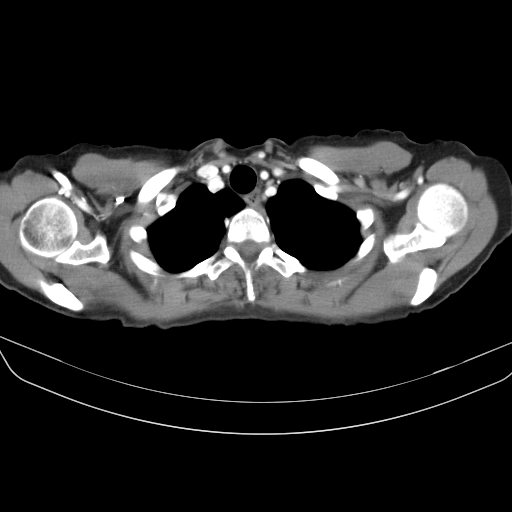
[im 122/130  lung]
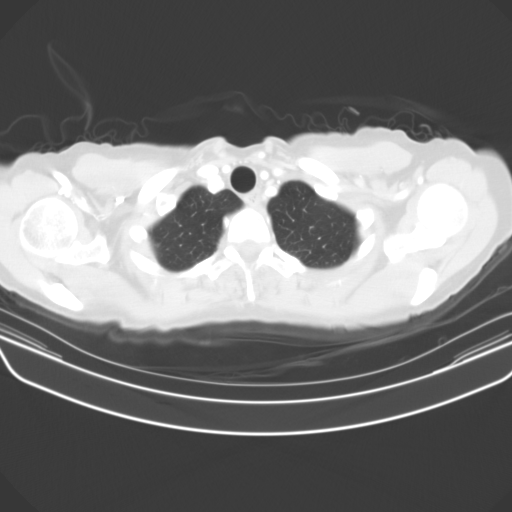

[13 of 32 positions shown; findings below may reference images not displayed]

FINDINGS: CT CHEST FINDINGS

Mediastinum/Lymph Nodes: No masses, pathologically enlarged lymph
nodes, or other significant abnormality.

Lungs/Pleura: No pulmonary mass, infiltrate, or effusion.

Musculoskeletal: No chest wall mass or suspicious bone lesions
identified.

CT ABDOMEN PELVIS FINDINGS

Hepatobiliary: 13 mm hypervascular lesion is seen in right hepatic
lobe on image 63 of series 2, likely representing a benign etiology
such as a hemangioma or focal nodular hyperplasia. No other liver
masses are identified. Gallbladder is unremarkable.

Pancreas: No mass, inflammatory changes, or other significant
abnormality.

Spleen: Within normal limits in size and appearance.

Adrenals/Urinary Tract: No masses identified. No evidence of
hydronephrosis.

Stomach/Bowel: Irregular masslike soft tissue density is seen along
the left lateral wall at the rectosigmoid junction which measures
approximately 2.1 x 2.2 cm on image 109. This is consistent with
newly diagnosed colon carcinoma.

Vascular/Lymphatic: A few tiny less than 5 mm left pericolonic lymph
nodes seen. These are not considered pathologic enlarged, however
early lymph node metastases cannot be excluded. No evidence of
abdominal aortic aneursym.

Reproductive: Prior hysterectomy noted. Adnexal regions are
unremarkable in appearance.

Other: None.

Musculoskeletal:  No suspicious bone lesions identified.
IMPRESSION: 2 cm masslike soft tissue density along the left lateral wall of the
rectosigmoid junction, consistent with newly diagnosed colon
carcinoma.

Tiny less than 5 mm left pericolonic lymph nodes are not considered
pathologically enlarged, however early lymph node metastases cannot
be excluded.

No definite evidence of distant metastatic disease.

1.3 cm hypervascular lesion in the right hepatic lobe, likely
representing a benign etiology such as flash filling hemangioma or
focal nodular hyperplasia. Pelvic MRI without and with contrast
recommended for further characterization.

## 2019-05-28 ENCOUNTER — Ambulatory Visit (INDEPENDENT_AMBULATORY_CARE_PROVIDER_SITE_OTHER): Payer: BC Managed Care – PPO | Admitting: Internal Medicine

## 2019-05-28 ENCOUNTER — Encounter: Payer: Self-pay | Admitting: Internal Medicine

## 2019-05-28 ENCOUNTER — Other Ambulatory Visit: Payer: Self-pay

## 2019-05-28 VITALS — BP 120/80 | HR 69 | Temp 98.1°F | Ht 68.0 in | Wt 132.5 lb

## 2019-05-28 DIAGNOSIS — C20 Malignant neoplasm of rectum: Secondary | ICD-10-CM | POA: Diagnosis not present

## 2019-05-28 DIAGNOSIS — S4992XA Unspecified injury of left shoulder and upper arm, initial encounter: Secondary | ICD-10-CM

## 2019-05-28 DIAGNOSIS — W108XXD Fall (on) (from) other stairs and steps, subsequent encounter: Secondary | ICD-10-CM

## 2019-05-28 DIAGNOSIS — S8992XA Unspecified injury of left lower leg, initial encounter: Secondary | ICD-10-CM

## 2019-05-28 NOTE — Progress Notes (Signed)
New Patient Office Visit     CC/Reason for Visit: Follow-up from fall of stairs Previous PCP: Ledora Bottcher, MD Last Visit: May 2019  HPI: Terri Salazar is a 58 y.o. female who is coming in today for the above mentioned reasons. Past Medical History is significant for: Stage I colorectal cancer diagnosed in 2016 due to fecal incontinence, had surgery only her 1 year follow-up colonoscopy was clean, scheduled for her 3-year colonoscopy soon with Dr. Collene Mares.  She is in the process of moving.  On July 4 she stood at the top landing on her steps to pull a tapestry of the wall, fell off the ladder and down 15 steps injuring her left side all the way from her shoulder down to her shin.  She went to an urgent care the next day and had x-rays of shoulder, arm, hip and was told everything was negative.  They did not image her thigh or calf and she was able to bear weight.  She still has significant bruising and edema to the left side of her body.  She is still having pain to the left leg and just wanted to make sure that no x-ray was needed.   Past Medical/Surgical History: Past Medical History:  Diagnosis Date  . Allergy   . Cancer ([place])   . Right bundle branch block    since 58 years old    Past Surgical History:  Procedure Laterality Date  . ABDOMINAL HYSTERECTOMY    . COLONOSCOPY    . ELBOW SURGERY Right   . LAPAROSCOPIC PARTIAL COLECTOMY N/A 09/13/2015   Procedure: LAPAROSCOPIC LOW ANTERIOR RESECTION OF RECTUM WITH MOBILIZATION OF SPLENIC FLEXURE;  Surgeon: Jackolyn Confer, MD;  Location: [facility];  Service: General;  Laterality: N/A;    Social History:  reports that she has never smoked. She has never used smokeless tobacco. She reports current alcohol use. She reports that she does not use drugs.  Allergies: No Known Allergies  Family History:  Mother died of pancreatic cancer.  Current Outpatient Medications:  .  acetaminophen (TYLENOL) 325 MG tablet, Take by mouth every 6  (six) hours as needed for headache., Disp: , Rfl:  .  calcium carbonate (OSCAL) 1500 (600 Ca) MG TABS tablet, Take by mouth 2 (two) times daily with a meal., Disp: , Rfl:  .  Cholecalciferol (VITAMIN D) 2000 UNITS CAPS, Take 2,000 Units by mouth daily., Disp: , Rfl:  .  diphenhydrAMINE (BENADRYL) 25 mg capsule, Take 25 mg by mouth at bedtime as needed for sleep. , Disp: , Rfl:  .  Multiple Vitamins-Minerals (MULTI FOR HER PO), Take 1 tablet by mouth daily. , Disp: , Rfl:  .  Polyethylene Glycol 3350 (MIRALAX PO), Take 17 g by mouth daily as needed (constipation). , Disp: , Rfl:  .  pseudoephedrine (SUDAFED) 30 MG tablet, Take 30 mg by mouth every 4 (four) hours as needed for congestion., Disp: , Rfl:  .  Misc Natural Products (RA GLUCOSAMINE-CHONDROITIN) TABS, Take 1 tablet by mouth daily., Disp: , Rfl:   Review of Systems:  Constitutional: Denies fever, chills, diaphoresis, appetite change and fatigue.  HEENT: Denies photophobia, eye pain, redness, hearing loss, ear pain, congestion, sore throat, rhinorrhea, sneezing, mouth sores, trouble swallowing, neck pain, neck stiffness and tinnitus.   Respiratory: Denies SOB, DOE, cough, chest tightness,  and wheezing.   Cardiovascular: Denies chest pain, palpitations and leg swelling.  Gastrointestinal: Denies nausea, vomiting, abdominal pain, diarrhea, constipation, blood in stool and  abdominal distention.  Genitourinary: Denies dysuria, urgency, frequency, hematuria, flank pain and difficulty urinating.  Endocrine: Denies: hot or cold intolerance, sweats, changes in hair or nails, polyuria, polydipsia. Musculoskeletal: Denies myalgias, back pain, joint swelling, arthralgias and gait problem.  Skin: Denies pallor, rash and wound.  Neurological: Denies dizziness, seizures, syncope, weakness, light-headedness, numbness and headaches.  Hematological: Denies adenopathy. Easy bruising, personal or family bleeding history  Psychiatric/Behavioral: Denies  suicidal ideation, mood changes, confusion, nervousness, sleep disturbance and agitation    Physical Exam: Vitals:   05/28/19 0727  BP: 120/80  Pulse: 69  Temp: 98.1 F (36.7 C)  TempSrc: Oral  SpO2: 98%  Weight: 132 lb 8 oz (60.1 kg)  Height: 5\' 8"  (3.300 m)   Body mass index is 20.15 kg/m.  Constitutional: NAD, calm, comfortable Eyes: PERRL, lids and conjunctivae normal, wears corrective lenses ENMT: Mucous membranes are moist. Musculoskeletal: no clubbing / cyanosis. No joint deformity upper and lower extremities.no contractures. Normal muscle tone.  Skin: Edema and bruising of left side of body, most noticeably over thigh. Neurologic: CN 2-12 grossly intact. Sensation intact, DTR normal. Strength 5/5 in all 4.  Psychiatric: Normal judgment and insight. Alert and oriented x 3. Normal mood.    Impression and Plan:  Fall (on) (from) other stairs and steps, subsequent encounter  -X-rays of shoulder, arm and hip are negative per patient report.  I do not have x-ray images readily available to me today.  Leg was not imaged, however I agree that as she is able to bear weight it is unlikely that she has a significant fracture. -I think pain is resulting from all the soft tissue trauma with the evident swelling and bruising on exam today. -Have advised continued use of ibuprofen, icing and she will contact us in 2 weeks if still significant pain is present.  Rectal cancer [facility]) s/p lap assisted LAR 09/13/15  -Has follow-up with Dr. Collene Mares soon for a 3-year surveillance colonoscopy.        Lelon Frohlich, MD [facility]

## 2019-08-22 ENCOUNTER — Other Ambulatory Visit: Payer: Self-pay

## 2019-08-22 ENCOUNTER — Encounter: Payer: Self-pay | Admitting: Internal Medicine

## 2019-08-22 ENCOUNTER — Ambulatory Visit (INDEPENDENT_AMBULATORY_CARE_PROVIDER_SITE_OTHER): Payer: BC Managed Care – PPO

## 2019-08-22 ENCOUNTER — Other Ambulatory Visit: Payer: Self-pay | Admitting: Internal Medicine

## 2019-08-22 ENCOUNTER — Ambulatory Visit (INDEPENDENT_AMBULATORY_CARE_PROVIDER_SITE_OTHER): Payer: BC Managed Care – PPO | Admitting: Internal Medicine

## 2019-08-22 VITALS — BP 110/62 | HR 69 | Temp 97.7°F | Wt 129.2 lb

## 2019-08-22 DIAGNOSIS — M25512 Pain in left shoulder: Secondary | ICD-10-CM

## 2019-08-22 DIAGNOSIS — R079 Chest pain, unspecified: Secondary | ICD-10-CM

## 2019-08-22 DIAGNOSIS — Z1159 Encounter for screening for other viral diseases: Secondary | ICD-10-CM

## 2019-08-22 DIAGNOSIS — G8929 Other chronic pain: Secondary | ICD-10-CM

## 2019-08-22 DIAGNOSIS — Z114 Encounter for screening for human immunodeficiency virus [HIV]: Secondary | ICD-10-CM

## 2019-08-22 DIAGNOSIS — C20 Malignant neoplasm of rectum: Secondary | ICD-10-CM | POA: Diagnosis not present

## 2019-08-22 DIAGNOSIS — Z0000 Encounter for general adult medical examination without abnormal findings: Secondary | ICD-10-CM

## 2019-08-22 LAB — CBC WITH DIFFERENTIAL/PLATELET
Basophils Absolute: 0.1 10*3/uL (ref 0.0–0.1)
Basophils Relative: 1.4 % (ref 0.0–3.0)
Eosinophils Absolute: 0.1 10*3/uL (ref 0.0–0.7)
Eosinophils Relative: 2 % (ref 0.0–5.0)
HCT: 40.2 % (ref 36.0–46.0)
Hemoglobin: 13.8 g/dL (ref 12.0–15.0)
Lymphocytes Relative: 41.9 % (ref 12.0–46.0)
Lymphs Abs: 2.3 10*3/uL (ref 0.7–4.0)
MCHC: 34.2 g/dL (ref 30.0–36.0)
MCV: 91.3 fl (ref 78.0–100.0)
Monocytes Absolute: 0.4 10*3/uL (ref 0.1–1.0)
Monocytes Relative: 7.3 % (ref 3.0–12.0)
Neutro Abs: 2.6 10*3/uL (ref 1.4–7.7)
Neutrophils Relative %: 47.4 % (ref 43.0–77.0)
Platelets: 273 10*3/uL (ref 150.0–400.0)
RBC: 4.4 Mil/uL (ref 3.87–5.11)
RDW: 12.9 % (ref 11.5–15.5)
WBC: 5.5 10*3/uL (ref 4.0–10.5)

## 2019-08-22 LAB — LIPID PANEL
Cholesterol: 237 mg/dL — ABNORMAL HIGH (ref 0–200)
HDL: 95.2 mg/dL (ref >39.00)
LDL Cholesterol: 119 mg/dL — ABNORMAL HIGH (ref 0–99)
NonHDL: 141.63
Total CHOL/HDL Ratio: 2
Triglycerides: 112 mg/dL (ref 0.0–149.0)
VLDL: 22.4 mg/dL (ref 0.0–40.0)

## 2019-08-22 LAB — COMPREHENSIVE METABOLIC PANEL
ALT: 17 U/L (ref 0–35)
AST: 15 U/L (ref 0–37)
Albumin: 4.6 g/dL (ref 3.5–5.2)
Alkaline Phosphatase: 53 U/L (ref 39–117)
BUN: 15 mg/dL (ref 6–23)
CO2: 30 mEq/L (ref 19–32)
Calcium: 9.8 mg/dL (ref 8.4–10.5)
Chloride: 102 mEq/L (ref 96–112)
Creatinine, Ser: 0.71 mg/dL (ref 0.40–1.20)
GFR: 84.59 mL/min (ref >60.00)
Glucose, Bld: 85 mg/dL (ref 70–99)
Potassium: 4.6 mEq/L (ref 3.5–5.1)
Sodium: 141 mEq/L (ref 135–145)
Total Bilirubin: 0.8 mg/dL (ref 0.2–1.2)
Total Protein: 7.1 g/dL (ref 6.0–8.3)

## 2019-08-22 LAB — VITAMIN B12: Vitamin B-12: 292 pg/mL (ref 211–911)

## 2019-08-22 LAB — VITAMIN D 25 HYDROXY (VIT D DEFICIENCY, FRACTURES): VITD: 101.41 ng/mL (ref 30.00–100.00)

## 2019-08-22 LAB — TSH: TSH: 2.21 u[IU]/mL (ref 0.35–4.50)

## 2019-08-22 LAB — HEMOGLOBIN A1C: Hgb A1c MFr Bld: 5.6 % (ref 4.6–6.5)

## 2019-08-22 NOTE — Patient Instructions (Addendum)
-Nice seeing you today!!  -Lab work today; will notify you once results are available.  -left shoulder xray today.  -Schedule follow up in 6 months.   Preventive Care 58-58 Years Old, Female Preventive care refers to visits with your health care provider and lifestyle choices that can promote health and wellness. This includes:  A yearly physical exam. This may also be called an annual well check.  Regular dental visits and eye exams.  Immunizations.  Screening for certain conditions.  Healthy lifestyle choices, such as eating a healthy diet, getting regular exercise, not using drugs or products that contain nicotine and tobacco, and limiting alcohol use. What can I expect for my preventive care visit? Physical exam Your health care provider will check your:  Height and weight. This may be used to calculate body mass index (BMI), which tells if you are at a healthy weight.  Heart rate and blood pressure.  Skin for abnormal spots. Counseling Your health care provider may ask you questions about your:  Alcohol, tobacco, and drug use.  Emotional well-being.  Home and relationship well-being.  Sexual activity.  Eating habits.  Work and work Statistician.  Method of birth control.  Menstrual cycle.  Pregnancy history. What immunizations do I need?  Influenza (flu) vaccine  This is recommended every year. Tetanus, diphtheria, and pertussis (Tdap) vaccine  You may need a Td booster every 10 years. Varicella (chickenpox) vaccine  You may need this if you have not been vaccinated. Zoster (shingles) vaccine  You may need this after age 54. Measles, mumps, and rubella (MMR) vaccine  You may need at least one dose of MMR if you were born in 1957 or later. You may also need a second dose. Pneumococcal conjugate (PCV13) vaccine  You may need this if you have certain conditions and were not previously vaccinated. Pneumococcal polysaccharide (PPSV23)  vaccine  You may need one or two doses if you smoke cigarettes or if you have certain conditions. Meningococcal conjugate (MenACWY) vaccine  You may need this if you have certain conditions. Hepatitis A vaccine  You may need this if you have certain conditions or if you travel or work in places where you may be exposed to hepatitis A. Hepatitis B vaccine  You may need this if you have certain conditions or if you travel or work in places where you may be exposed to hepatitis B. Haemophilus influenzae type b (Hib) vaccine  You may need this if you have certain conditions. Human papillomavirus (HPV) vaccine  If recommended by your health care provider, you may need three doses over 6 months. You may receive vaccines as individual doses or as more than one vaccine together in one shot (combination vaccines). Talk with your health care provider about the risks and benefits of combination vaccines. What tests do I need? Blood tests  Lipid and cholesterol levels. These may be checked every 5 years, or more frequently if you are over 4 years old.  Hepatitis C test.  Hepatitis B test. Screening  Lung cancer screening. You may have this screening every year starting at age 72 if you have a 30-pack-year history of smoking and currently smoke or have quit within the past 15 years.  Colorectal cancer screening. All adults should have this screening starting at age 64 and continuing until age 78. Your health care provider may recommend screening at age 79 if you are at increased risk. You will have tests every 1-10 years, depending on your results and the  type of screening test.  Diabetes screening. This is done by checking your blood sugar (glucose) after you have not eaten for a while (fasting). You may have this done every 1-3 years.  Mammogram. This may be done every 1-2 years. Talk with your health care provider about when you should start having regular mammograms. This may depend on  whether you have a family history of breast cancer.  BRCA-related cancer screening. This may be done if you have a family history of breast, ovarian, tubal, or peritoneal cancers.  Pelvic exam and Pap test. This may be done every 3 years starting at age 66. Starting at age 50, this may be done every 5 years if you have a Pap test in combination with an HPV test. Other tests  Sexually transmitted disease (STD) testing.  Bone density scan. This is done to screen for osteoporosis. You may have this scan if you are at high risk for osteoporosis. Follow these instructions at home: Eating and drinking  Eat a diet that includes fresh fruits and vegetables, whole grains, lean protein, and low-fat dairy.  Take vitamin and mineral supplements as recommended by your health care provider.  Do not drink alcohol if: ? Your health care provider tells you not to drink. ? You are pregnant, may be pregnant, or are planning to become pregnant.  If you drink alcohol: ? Limit how much you have to 0-1 drink a day. ? Be aware of how much alcohol is in your drink. In the [place] one drink equals one 12 oz bottle of beer (355 mL), one 5 oz glass of wine (148 mL), or one 1 oz glass of hard liquor (44 mL). Lifestyle  Take daily care of your teeth and gums.  Stay active. Exercise for at least 30 minutes on 5 or more days each week.  Do not use any products that contain nicotine or tobacco, such as cigarettes, e-cigarettes, and chewing tobacco. If you need help quitting, ask your health care provider.  If you are sexually active, practice safe sex. Use a condom or other form of birth control (contraception) in order to prevent pregnancy and STIs (sexually transmitted infections).  If told by your health care provider, take low-dose aspirin daily starting at age 81. What's next?  Visit your health care provider once a year for a well check visit.  Ask your health care provider how often you should have your  eyes and teeth checked.  Stay up to date on all vaccines. This information is not intended to replace advice given to you by your health care provider. Make sure you discuss any questions you have with your health care provider. Document Released: 11/26/2015 Document Revised: 07/11/2018 Document Reviewed: 07/11/2018 Elsevier Patient Education  2020 [employer].

## 2019-08-22 NOTE — Addendum Note (Signed)
Addended by: Suzette Battiest on: 08/22/2019 08:58 AM   Modules accepted: Orders

## 2019-08-22 NOTE — Progress Notes (Signed)
Established Patient Office Visit     CC/Reason for Visit: Annual preventive exam  HPI: Terri Salazar is a 58 y.o. female who is coming in today for the above mentioned reasons. Past Medical History is significant for: Stage I colorectal cancer diagnosed in 2016, due for her 4-year surveillance colonoscopy this year.  She follows with Dr. Collene Mares.  She had a fall on July 4 off of a ladder.  Tells me she had a chest x-ray that did not show any rib fractures but she continues to have some anterior chest pain that she believes is related to the fall.  She also denotes high anxiety levels as her husband is being treated for thyroid cancer and her mother died earlier this year with pancreatic cancer.  Sisters having the same symptoms.  She has no family history of coronary artery disease.  Her chest pain is fairly atypical, does not come on with exertion, no radiation, no associated symptoms such as dizziness, palpitations or shortness of breath.  Her vaccinations are up-to-date, she sees her GYN in December who has her scheduled for mammogram, Pap smear and DEXA scan.  She has routine eye and dental care.  She is due for annual dermatology check later this month.     Past Medical/Surgical History: Past Medical History:  Diagnosis Date  . Allergy   . Cancer ([place])   . Right bundle branch block    since 58 years old    Past Surgical History:  Procedure Laterality Date  . ABDOMINAL HYSTERECTOMY    . COLONOSCOPY    . ELBOW SURGERY Right   . LAPAROSCOPIC PARTIAL COLECTOMY N/A 09/13/2015   Procedure: LAPAROSCOPIC LOW ANTERIOR RESECTION OF RECTUM WITH MOBILIZATION OF SPLENIC FLEXURE;  Surgeon: Jackolyn Confer, MD;  Location: [facility];  Service: General;  Laterality: N/A;    Social History:  reports that she has never smoked. She has never used smokeless tobacco. She reports current alcohol use. She reports that she does not use drugs.  Allergies: No Known Allergies  Family History:  Mother  with pancreatic cancer, no history of heart disease, stroke  Current Outpatient Medications:  .  acetaminophen (TYLENOL) 325 MG tablet, Take by mouth every 6 (six) hours as needed for headache., Disp: , Rfl:  .  calcium carbonate (OSCAL) 1500 (600 Ca) MG TABS tablet, Take by mouth 2 (two) times daily with a meal., Disp: , Rfl:  .  Cholecalciferol (VITAMIN D) 2000 UNITS CAPS, Take 2,000 Units by mouth daily., Disp: , Rfl:  .  diphenhydrAMINE (BENADRYL) 25 mg capsule, Take 25 mg by mouth at bedtime as needed for sleep. , Disp: , Rfl:  .  Multiple Vitamins-Minerals (MULTI FOR HER PO), Take 1 tablet by mouth daily. , Disp: , Rfl:  .  Polyethylene Glycol 3350 (MIRALAX PO), Take 17 g by mouth daily as needed (constipation). , Disp: , Rfl:  .  pseudoephedrine (SUDAFED) 30 MG tablet, Take 30 mg by mouth every 4 (four) hours as needed for congestion., Disp: , Rfl:   Review of Systems: Constitutional: Denies fever, chills, diaphoresis, appetite change and fatigue.  HEENT: Denies photophobia, eye pain, redness, hearing loss, ear pain, congestion, sore throat, rhinorrhea, sneezing, mouth sores, trouble swallowing, neck pain, neck stiffness and tinnitus.   Respiratory: Denies SOB, DOE, cough, chest tightness,  and wheezing.   Cardiovascular: Denies  palpitations and leg swelling.  Gastrointestinal: Denies nausea, vomiting, abdominal pain, diarrhea, constipation, blood in stool and abdominal distention.  Genitourinary:  Denies dysuria, urgency, frequency, hematuria, flank pain and difficulty urinating.  Endocrine: Denies: hot or cold intolerance, sweats, changes in hair or nails, polyuria, polydipsia. Musculoskeletal: Denies myalgias, back pain, joint swelling, arthralgias and gait problem.  Skin: Denies pallor, rash and wound.  Neurological: Denies dizziness, seizures, syncope, weakness, light-headedness, numbness and headaches.  Hematological: Denies adenopathy. Easy bruising, personal or family bleeding  history  Psychiatric/Behavioral: Denies suicidal ideation, mood changes, confusion, nervousness, sleep disturbance and agitation    Physical Exam: Vitals:   08/22/19 0821  BP: 110/62  Pulse: 69  Temp: 97.7 F (36.5 C)  TempSrc: Temporal  SpO2: 98%  Weight: 129 lb 3.2 oz (58.6 kg)    Body mass index is 19.64 kg/m.   Constitutional: NAD, calm, comfortable Eyes: PERRL, lids and conjunctivae normal, wears corrective lenses ENMT: Mucous membranes are moist.  Tympanic membrane is pearly white, no erythema or bulging. Neck: normal, supple, no masses, no thyromegaly Respiratory: clear to auscultation bilaterally, no wheezing, no crackles. Normal respiratory effort. No accessory muscle use.  Cardiovascular: Regular rate and rhythm, no murmurs / rubs / gallops. No extremity edema. 2+ pedal pulses.  Abdomen: no tenderness, no masses palpated. No hepatosplenomegaly. Bowel sounds positive.  Musculoskeletal: no clubbing / cyanosis. No joint deformity upper and lower extremities. Good ROM, no contractures. Normal muscle tone.  Skin: no rashes, lesions, ulcers. No induration Neurologic: CN 2-12 grossly intact. Sensation intact, DTR normal. Strength 5/5 in all 4.  Psychiatric: Normal judgment and insight. Alert and oriented x 3. Normal mood.    Impression and Plan:  Encounter for preventive health examination -She has routine eye and dental care. -Her vaccinations are up-to-date, she received her first shingles vaccine in September, second 2 in December. -Check HIV and hepatitis C status today. -Healthy lifestyle discussed in detail. -Screening labs today. -She has annual dermatology check. -Colonoscopy is already scheduled for later this year as a surveillance to her stage I rectal cancer. -Pap smear and mammogram due in December and followed through Dr. Marylynn Pearson with GYN.  Chest pain, unspecified type  -EKG in office today and interpreted by myself: Normal sinus rhythm, left  anterior fascicular block, no acute ischemic changes. -No concerning signs, symptoms, no further cardiac work-up at this time. -Suspect related to either fall with rib contusions or mild costochondritis, NSAIDs and icing advised.  Rectal cancer [facility]) s/p lap assisted LAR 09/13/15 -Due for surveillance colonoscopy later this month with Dr. Collene Mares.  Chronic left shoulder pain  -Ever since her fall in July, she has significant limitation of range of motion, states pain is in her shoulder and upper arm. -Will start with shoulder and humerus x-ray today, if normal may need to consider MRI of her shoulder as I am concerned for rotator cuff tendinopathy given her limited range of motion.  Encounter for screening for HIV  - Plan: HIV antibody (with reflex)  Encounter for hepatitis C screening test for low risk patient -  Plan: Hep C Antibody    Patient Instructions  -Nice seeing you today!!  -Lab work today; will notify you once results are available.  -left shoulder xray today.     Lelon Frohlich, MD [facility]

## 2019-08-25 LAB — HIV ANTIBODY (ROUTINE TESTING W REFLEX): HIV 1&2 Ab, 4th Generation: NONREACTIVE

## 2019-08-25 LAB — HEPATITIS C ANTIBODY
Hepatitis C Ab: NONREACTIVE
SIGNAL TO CUT-OFF: 0 (ref <1.00)

## 2019-08-27 ENCOUNTER — Encounter: Payer: BC Managed Care – PPO | Admitting: Internal Medicine

## 2019-08-29 LAB — HM COLONOSCOPY

## 2019-09-05 ENCOUNTER — Encounter: Payer: Self-pay | Admitting: Internal Medicine

## 2019-09-05 ENCOUNTER — Other Ambulatory Visit: Payer: Self-pay | Admitting: Internal Medicine

## 2019-09-05 DIAGNOSIS — R079 Chest pain, unspecified: Secondary | ICD-10-CM

## 2019-09-08 ENCOUNTER — Ambulatory Visit
Admission: RE | Admit: 2019-09-08 | Discharge: 2019-09-08 | Disposition: A | Discharge status: Home / Self Care | Payer: BC Managed Care – PPO | Source: Ambulatory Visit | Attending: Internal Medicine | Admitting: Internal Medicine

## 2019-09-08 DIAGNOSIS — G8929 Other chronic pain: Secondary | ICD-10-CM

## 2019-09-09 ENCOUNTER — Other Ambulatory Visit: Payer: Self-pay | Admitting: Internal Medicine

## 2019-09-09 DIAGNOSIS — G8929 Other chronic pain: Secondary | ICD-10-CM

## 2019-09-09 DIAGNOSIS — M25512 Pain in left shoulder: Secondary | ICD-10-CM

## 2019-09-11 ENCOUNTER — Other Ambulatory Visit: Payer: Self-pay

## 2019-09-11 ENCOUNTER — Ambulatory Visit: Payer: BC Managed Care – PPO | Admitting: Orthopaedic Surgery

## 2019-09-11 ENCOUNTER — Encounter: Payer: Self-pay | Admitting: Orthopaedic Surgery

## 2019-09-11 DIAGNOSIS — S42255A Nondisplaced fracture of greater tuberosity of left humerus, initial encounter for closed fracture: Secondary | ICD-10-CM

## 2019-09-11 DIAGNOSIS — S42253A Displaced fracture of greater tuberosity of unspecified humerus, initial encounter for closed fracture: Secondary | ICD-10-CM | POA: Insufficient documentation

## 2019-09-11 NOTE — Progress Notes (Signed)
Office Visit Note   Patient: Terri Salazar           Date of Birth: 12-31-1960           MRN: [id number] Visit Date: 09/11/2019              Requested by: Isaac Bliss, Rayford Halsted, MD [place],  [place] [address] PCP: Isaac Bliss, Rayford Halsted, MD   Assessment & Plan: Visit Diagnoses:  1. Closed nondisplaced fracture of greater tuberosity of left humerus, initial encounter     Plan:  #1: She has an exercise program at home which she can use as a strengthening plan. #2: Possible use of Voltaren gel may have some benefit. #3: She hopefully will continue to heal this nondisplaced fracture however if she continues to have symptoms she can return. #4: Patient evaluated with Dr. Durward Fortes.   Follow-Up Instructions: No follow-ups on file.   Orders:  No orders of the defined types were placed in this encounter.  No orders of the defined types were placed in this encounter.     Procedures: No procedures performed   Clinical Data: No additional findings.   Subjective: Chief Complaint  Patient presents with  . Left Shoulder - Pain   HPI Patient presents today for the left shoulder. She injured her shoulder on 05/17/2019. She was on a ladder and fell, landing on her right side on a staircase. She fractured her ribs and had left shoulder pain. She saw her PCP and imaging was ordered. She had x-rays on 08/22/2019 and an MRI on 09/08/2019. She does not take anything for pain. She has good range of motion. She has pain when lifts her arm to look at her watch or when washing dishes. She is right hand dominant. No weakness, numbness, or tingling.  Has been on Vit D.   Review of Systems  Constitutional: Negative for fatigue.  HENT: Negative for ear pain.   Eyes: Negative for pain.  Cardiovascular: Negative for leg swelling.  Gastrointestinal: Positive for constipation. Negative for diarrhea.  Endocrine: Negative for cold intolerance and heat intolerance.   Genitourinary: Negative for difficulty urinating.  Musculoskeletal: Positive for joint swelling.  Skin: Negative for rash.  Allergic/Immunologic: Negative for food allergies.  Neurological: Negative for weakness.  Hematological: Does not bruise/bleed easily.  Psychiatric/Behavioral: Positive for sleep disturbance.     Objective: Vital Signs: Ht 5\' 8"  (1.727 m)   Wt 128 lb (58.1 kg)   BMI 19.46 kg/m   Physical Exam Constitutional:      Appearance: Normal appearance. She is well-developed.  HENT:     Head: Normocephalic.  Eyes:     Pupils: Pupils are equal, round, and reactive to light.  Pulmonary:     Effort: Pulmonary effort is normal.  Skin:    General: Skin is warm and dry.  Neurological:     Mental Status: She is alert and oriented to person, place, and time.  Psychiatric:        Behavior: Behavior normal.     Ortho Exam  Exam today reveals a shoulder without any ecchymosis.  She is tender over the greater tuberosity minimally.  A little bit of tenderness over the biceps tendon.  She lacks about 30 degrees of motion in abduction and forward flexion.  Also internal rotation causes her pain and discomfort.  Good strength otherwise.  Neuro vas intact distally.  Negative empty can test.  Specialty Comments:  No specialty comments available.  Imaging:  Mr Shoulder Left Wo Contrast  Result Date: 09/08/2019 CLINICAL DATA:  Persistent left shoulder pain since falling off a ladder 3 months ago. EXAM: MRI OF THE LEFT SHOULDER WITHOUT CONTRAST TECHNIQUE: Multiplanar, multisequence MR imaging of the shoulder was performed. No intravenous contrast was administered. COMPARISON:  Left shoulder x-rays dated August 22, 2019. FINDINGS: Rotator cuff: Mild supraspinatus tendinosis with small focal low-grade partial-thickness articular surface tear anteriorly at the insertion. The infraspinatus, teres minor, and subscapularis tendons are unremarkable. Muscles: No atrophy or abnormal signal  of the muscles of the rotator cuff. Biceps long head:  Intact and normally positioned. Acromioclavicular Joint: Normal acromioclavicular joint. Type I acromion. Small amount of fluid in the subacromial/subdeltoid bursa. Glenohumeral Joint: No joint effusion. No chondral defect. Labrum: Grossly intact, but evaluation is limited by lack of intraarticular fluid. Bones: Small subacute nondisplaced fracture of the anterior greater tuberosity. No dislocation. No suspicious bone lesion. Other: None. IMPRESSION: 1. Small subacute nondisplaced fracture of the anterior greater tuberosity. 2. Mild supraspinatus tendinosis with small focal low-grade partial-thickness articular surface tear anteriorly at the insertion. 3. Mild subacromial/subdeltoid bursitis. Electronically Signed   By: Titus Dubin M.D.   On: 09/08/2019 10:14     PMFS History: Current Outpatient Medications  Medication Sig Dispense Refill  . acetaminophen (TYLENOL) 325 MG tablet Take by mouth every 6 (six) hours as needed for headache.    . calcium carbonate (OSCAL) 1500 (600 Ca) MG TABS tablet Take by mouth 2 (two) times daily with a meal.    . Cholecalciferol (VITAMIN D) 2000 UNITS CAPS Take 2,000 Units by mouth daily.    . diphenhydrAMINE (BENADRYL) 25 mg capsule Take 25 mg by mouth at bedtime as needed for sleep.     . Multiple Vitamins-Minerals (MULTI FOR HER PO) Take 1 tablet by mouth daily.     . Polyethylene Glycol 3350 (MIRALAX PO) Take 17 g by mouth daily as needed (constipation).     . pseudoephedrine (SUDAFED) 30 MG tablet Take 30 mg by mouth every 4 (four) hours as needed for congestion.     No current facility-administered medications for this visit.     Patient Active Problem List   Diagnosis Date Noted  . Fracture, humerus, greater tuberosity 09/11/2019  . Rectal cancer [facility]) s/p lap assisted LAR 09/13/15 09/13/2015   Past Medical History:  Diagnosis Date  . Allergy   . Cancer ([place])   . Right bundle branch block     since 58 years old    History reviewed. No pertinent family history.  Past Surgical History:  Procedure Laterality Date  . ABDOMINAL HYSTERECTOMY    . COLONOSCOPY    . ELBOW SURGERY Right   . LAPAROSCOPIC PARTIAL COLECTOMY N/A 09/13/2015   Procedure: LAPAROSCOPIC LOW ANTERIOR RESECTION OF RECTUM WITH MOBILIZATION OF SPLENIC FLEXURE;  Surgeon: Jackolyn Confer, MD;  Location: [facility];  Service: General;  Laterality: N/A;   Social History   Occupational History  . Not on file  Tobacco Use  . Smoking status: Never Smoker  . Smokeless tobacco: Never Used  Substance and Sexual Activity  . Alcohol use: Yes    Alcohol/week: 0.0 standard drinks    Comment: occasional/social  . Drug use: No  . Sexual activity: Not on file

## 2019-10-22 ENCOUNTER — Other Ambulatory Visit: Payer: Self-pay

## 2019-10-22 ENCOUNTER — Ambulatory Visit (INDEPENDENT_AMBULATORY_CARE_PROVIDER_SITE_OTHER): Payer: BC Managed Care – PPO | Admitting: *Deleted

## 2019-10-22 DIAGNOSIS — Z23 Encounter for immunization: Secondary | ICD-10-CM

## 2019-10-22 NOTE — Progress Notes (Signed)
Per orders of Dr. Jerilee Hoh, injection of Shingrix given by Zacarias Pontes. Patient tolerated injection well.

## 2019-10-30 LAB — HM PAP SMEAR

## 2019-10-30 LAB — HM MAMMOGRAPHY: HM Mammogram: NORMAL (ref 0–4)

## 2019-12-19 ENCOUNTER — Encounter: Payer: Self-pay | Admitting: Adult Health

## 2019-12-19 ENCOUNTER — Other Ambulatory Visit: Payer: Self-pay

## 2019-12-19 ENCOUNTER — Ambulatory Visit: Payer: BC Managed Care – PPO | Admitting: Adult Health

## 2019-12-19 DIAGNOSIS — M545 Low back pain, unspecified: Secondary | ICD-10-CM

## 2019-12-19 DIAGNOSIS — R140 Abdominal distension (gaseous): Secondary | ICD-10-CM

## 2019-12-19 LAB — POCT URINALYSIS DIPSTICK
Bilirubin, UA: NEGATIVE
Blood, UA: NEGATIVE
Glucose, UA: NEGATIVE
Ketones, UA: NEGATIVE
Leukocytes, UA: NEGATIVE
Nitrite, UA: NEGATIVE
Protein, UA: NEGATIVE
Spec Grav, UA: 1.025 (ref 1.010–1.025)
Urobilinogen, UA: 0.2 E.U./dL
pH, UA: 6 (ref 5.0–8.0)

## 2019-12-19 NOTE — Progress Notes (Signed)
   Subjective:    Patient ID: Terri Salazar, female    DOB: Jul 09, 1961, 59 y.o.   MRN: [id number]  HPI    Review of Systems     Objective:   Physical Exam        Assessment & Plan:

## 2019-12-19 NOTE — Progress Notes (Signed)
Subjective:    Patient ID: Terri Salazar, female    DOB: 25-Feb-1961, 59 y.o.   MRN: [id number]  HPI 59 year old  has a past medical history of Allergy, Cancer ([place]), and Right bundle branch block.  She is being evaluated today for low back pain and abdominal bloating.    She has a history of colorectal cancer in 2016.  Her gastroenterologist is Dr. Collene Mares, she had her 3-year colonoscopy in October at which time 3 polyps were removed.  She reports last night he started to experience abdominal bloating and lower right-sided back pain.  She does report a history of low back pain or muscle strains but that this pain "feels different than it has in the past".  She has been moving into a new home.  She took 2 Tylenol yesterday evening before bed and woke up with worsening back pain.  She took 2 more Tylenol earlier today which to slightly.  Additionally, over the last 3 weeks she has been having alternating bowel habits of diarrhea and constipation, more on the constipation side.  About a week ago she started eating more vegetables, using MiraLAX, and eating fiber one bars.   Last bowel movement was about 3 hours ago and reports that this bowel movement was soft but not a large quantity.  Denies fevers, chills, nausea, vomiting, or UTI symptoms   Review of Systems See HPI   Past Medical History:  Diagnosis Date  . Allergy   . Cancer ([place])   . Right bundle branch block    since 59 years old    Social History   Socioeconomic History  . Marital status: Married    Spouse name: Not on file  . Number of children: Not on file  . Years of education: Not on file  . Highest education level: Not on file  Occupational History  . Not on file  Tobacco Use  . Smoking status: Never Smoker  . Smokeless tobacco: Never Used  Substance and Sexual Activity  . Alcohol use: Yes    Alcohol/week: 0.0 standard drinks    Comment: occasional/social  . Drug use: No  . Sexual activity: Not on file    Other Topics Concern  . Not on file  Social History Narrative  . Not on file   Social Determinants of Health   Financial Resource Strain:   . Difficulty of Paying Living Expenses: Not on file  Food Insecurity:   . Worried About Charity fundraiser in the Last Year: Not on file  . Ran Out of Food in the Last Year: Not on file  Transportation Needs:   . Lack of Transportation (Medical): Not on file  . Lack of Transportation (Non-Medical): Not on file  Physical Activity:   . Days of Exercise per Week: Not on file  . Minutes of Exercise per Session: Not on file  Stress:   . Feeling of Stress : Not on file  Social Connections:   . Frequency of Communication with Friends and Family: Not on file  . Frequency of Social Gatherings with Friends and Family: Not on file  . Attends Religious Services: Not on file  . Active Member of Clubs or Organizations: Not on file  . Attends Archivist Meetings: Not on file  . Marital Status: Not on file  Intimate Partner Violence:   . Fear of Current or Ex-Partner: Not on file  . Emotionally Abused: Not on file  . Physically Abused: Not  on file  . Sexually Abused: Not on file    Past Surgical History:  Procedure Laterality Date  . ABDOMINAL HYSTERECTOMY    . COLONOSCOPY    . ELBOW SURGERY Right   . LAPAROSCOPIC PARTIAL COLECTOMY N/A 09/13/2015   Procedure: LAPAROSCOPIC LOW ANTERIOR RESECTION OF RECTUM WITH MOBILIZATION OF SPLENIC FLEXURE;  Surgeon: Jackolyn Confer, MD;  Location: [facility];  Service: General;  Laterality: N/A;    History reviewed. No pertinent family history.  No Known Allergies  Current Outpatient Medications on File Prior to Visit  Medication Sig Dispense Refill  . acetaminophen (TYLENOL) 325 MG tablet Take by mouth every 6 (six) hours as needed for headache.    . calcium carbonate (OSCAL) 1500 (600 Ca) MG TABS tablet Take by mouth 2 (two) times daily with a meal.    . Cholecalciferol (VITAMIN D) 2000 UNITS CAPS  Take 2,000 Units by mouth daily.    . diphenhydrAMINE (BENADRYL) 25 mg capsule Take 25 mg by mouth at bedtime as needed for sleep.     . Multiple Vitamins-Minerals (MULTI FOR HER PO) Take 1 tablet by mouth daily.     . Polyethylene Glycol 3350 (MIRALAX PO) Take 17 g by mouth daily as needed (constipation).     . pseudoephedrine (SUDAFED) 30 MG tablet Take 30 mg by mouth every 4 (four) hours as needed for congestion.     No current facility-administered medications on file prior to visit.    There were no vitals taken for this visit.      Objective:   Physical Exam Vitals and nursing note reviewed.  Constitutional:      General: She is not in acute distress.    Appearance: Normal appearance. She is well-developed.  Pulmonary:     Effort: Pulmonary effort is normal.  Abdominal:     General: Abdomen is flat. Bowel sounds are increased.     Palpations: Abdomen is soft.     Tenderness: There is no abdominal tenderness. There is no right CVA tenderness or left CVA tenderness. Negative signs include Murphy's sign and McBurney's sign.     Hernia: No hernia is present.     Comments: Bloating noted   Musculoskeletal:        General: Tenderness (mild tenderness to right lower back. No spinal tenderness) present. No swelling. Normal range of motion.  Skin:    General: Skin is warm and dry.  Neurological:     General: No focal deficit present.     Mental Status: She is alert and oriented to person, place, and time.  Psychiatric:        Mood and Affect: Mood normal.        Behavior: Behavior normal.        Thought Content: Thought content normal.        Judgment: Judgment normal.        Assessment & Plan:  1. Bloating -Likely due to abrupt increase in the amount of dietary fiber.  Advised to cut back.  Due to history will check KUB as well as blood work.  Can consider CT in the future - POC Urinalysis Dipstick- negative  - DG Abd 1 View; Future - CBC with Differential/Platelet;  Future - Comprehensive metabolic panel - Lipase - Amylase  2. Midline low back pain, unspecified chronicity, unspecified whether sciatica present -Possibly from lifting or bloating. -She can continue to take Tylenol and use a heating pad - POC Urinalysis Dipstick - DG Lumbar Spine Complete; Future  Dorothyann Peng, NP

## 2019-12-22 ENCOUNTER — Other Ambulatory Visit (INDEPENDENT_AMBULATORY_CARE_PROVIDER_SITE_OTHER): Payer: BC Managed Care – PPO

## 2019-12-22 ENCOUNTER — Other Ambulatory Visit: Payer: Self-pay

## 2019-12-22 DIAGNOSIS — R140 Abdominal distension (gaseous): Secondary | ICD-10-CM

## 2019-12-22 LAB — CBC WITH DIFFERENTIAL/PLATELET
Basophils Absolute: 0.1 10*3/uL (ref 0.0–0.1)
Basophils Relative: 0.8 % (ref 0.0–3.0)
Eosinophils Absolute: 0.1 10*3/uL (ref 0.0–0.7)
Eosinophils Relative: 0.9 % (ref 0.0–5.0)
HCT: 42.7 % (ref 36.0–46.0)
Hemoglobin: 14.2 g/dL (ref 12.0–15.0)
Lymphocytes Relative: 31.7 % (ref 12.0–46.0)
Lymphs Abs: 2.4 10*3/uL (ref 0.7–4.0)
MCHC: 33.2 g/dL (ref 30.0–36.0)
MCV: 91.9 fl (ref 78.0–100.0)
Monocytes Absolute: 0.5 10*3/uL (ref 0.1–1.0)
Monocytes Relative: 6.3 % (ref 3.0–12.0)
Neutro Abs: 4.5 10*3/uL (ref 1.4–7.7)
Neutrophils Relative %: 60.3 % (ref 43.0–77.0)
Platelets: 261 10*3/uL (ref 150.0–400.0)
RBC: 4.64 Mil/uL (ref 3.87–5.11)
RDW: 12.5 % (ref 11.5–15.5)
WBC: 7.4 10*3/uL (ref 4.0–10.5)

## 2019-12-25 ENCOUNTER — Other Ambulatory Visit (INDEPENDENT_AMBULATORY_CARE_PROVIDER_SITE_OTHER): Payer: BC Managed Care – PPO

## 2019-12-25 ENCOUNTER — Ambulatory Visit (INDEPENDENT_AMBULATORY_CARE_PROVIDER_SITE_OTHER): Payer: BC Managed Care – PPO

## 2019-12-25 ENCOUNTER — Other Ambulatory Visit: Payer: Self-pay

## 2019-12-25 DIAGNOSIS — M545 Low back pain, unspecified: Secondary | ICD-10-CM

## 2019-12-25 DIAGNOSIS — R140 Abdominal distension (gaseous): Secondary | ICD-10-CM

## 2019-12-25 DIAGNOSIS — R079 Chest pain, unspecified: Secondary | ICD-10-CM | POA: Diagnosis not present

## 2019-12-25 LAB — COMPREHENSIVE METABOLIC PANEL
ALT: 16 U/L (ref 0–35)
AST: 15 U/L (ref 0–37)
Albumin: 4.8 g/dL (ref 3.5–5.2)
Alkaline Phosphatase: 55 U/L (ref 39–117)
BUN: 18 mg/dL (ref 6–23)
CO2: 27 mEq/L (ref 19–32)
Calcium: 9.9 mg/dL (ref 8.4–10.5)
Chloride: 105 mEq/L (ref 96–112)
Creatinine, Ser: 0.71 mg/dL (ref 0.40–1.20)
GFR: 84.49 mL/min (ref >60.00)
Glucose, Bld: 114 mg/dL — ABNORMAL HIGH (ref 70–99)
Potassium: 4.2 mEq/L (ref 3.5–5.1)
Sodium: 140 mEq/L (ref 135–145)
Total Bilirubin: 0.6 mg/dL (ref 0.2–1.2)
Total Protein: 7.1 g/dL (ref 6.0–8.3)

## 2019-12-25 LAB — AMYLASE: Amylase: 33 U/L (ref 27–131)

## 2019-12-25 LAB — LIPASE: Lipase: 18 U/L (ref 11.0–59.0)

## 2019-12-25 NOTE — Addendum Note (Signed)
Addended by: Suzette Battiest on: 12/25/2019 07:49 AM   Modules accepted: Orders

## 2020-01-15 ENCOUNTER — Encounter: Payer: Self-pay | Admitting: Internal Medicine

## 2020-02-26 ENCOUNTER — Other Ambulatory Visit: Payer: Self-pay

## 2020-02-27 ENCOUNTER — Ambulatory Visit (INDEPENDENT_AMBULATORY_CARE_PROVIDER_SITE_OTHER): Payer: BC Managed Care – PPO | Admitting: Internal Medicine

## 2020-02-27 ENCOUNTER — Encounter: Payer: Self-pay | Admitting: Internal Medicine

## 2020-02-27 VITALS — BP 118/70 | HR 75 | Temp 97.6°F | Wt 133.3 lb

## 2020-02-27 DIAGNOSIS — M545 Low back pain, unspecified: Secondary | ICD-10-CM

## 2020-02-27 DIAGNOSIS — M25512 Pain in left shoulder: Secondary | ICD-10-CM

## 2020-02-27 DIAGNOSIS — M5417 Radiculopathy, lumbosacral region: Secondary | ICD-10-CM | POA: Diagnosis not present

## 2020-02-27 NOTE — Progress Notes (Signed)
Established Patient Office Visit     This visit occurred during the SARS-CoV-2 public health emergency.  Safety protocols were in place, including screening questions prior to the visit, additional usage of staff PPE, and extensive cleaning of exam room while observing appropriate contact time as indicated for disinfecting solutions.    CC/Reason for Visit: Discussed left shoulder and right back and leg pain  HPI: Terri Salazar is a 59 y.o. female who is coming in today for the above mentioned reasons.  She suffered a nondisplaced fracture of of the anterior greater tuberosity of her left humerus.  She saw orthopedics for this conservative therapy was recommended.  She has noticed significant decrease in range of motion.  She is wondering what can be done.  She has also noticed intermittent pain in her right lower back that spreads into her buttocks, lateral thigh and into the arch of her foot.  This feels achy and also bothers her.   Past Medical/Surgical History: Past Medical History:  Diagnosis Date  . Allergy   . Cancer ([place])   . Right bundle branch block    since 59 years old    Past Surgical History:  Procedure Laterality Date  . ABDOMINAL HYSTERECTOMY    . COLONOSCOPY    . ELBOW SURGERY Right   . LAPAROSCOPIC PARTIAL COLECTOMY N/A 09/13/2015   Procedure: LAPAROSCOPIC LOW ANTERIOR RESECTION OF RECTUM WITH MOBILIZATION OF SPLENIC FLEXURE;  Surgeon: Jackolyn Confer, MD;  Location: [facility];  Service: General;  Laterality: N/A;    Social History:  reports that she has never smoked. She has never used smokeless tobacco. She reports current alcohol use. She reports that she does not use drugs.  Allergies: No Known Allergies  Family History:  No history of heart disease, cancer, stroke that she is aware of  Current Outpatient Medications:  .  calcium carbonate (OSCAL) 1500 (600 Ca) MG TABS tablet, Take by mouth 2 (two) times daily with a meal., Disp: , Rfl:  .   Cholecalciferol (VITAMIN D) 2000 UNITS CAPS, Take 2,000 Units by mouth daily., Disp: , Rfl:  .  diphenhydrAMINE (BENADRYL) 25 mg capsule, Take 25 mg by mouth at bedtime as needed for sleep. , Disp: , Rfl:  .  Multiple Vitamins-Minerals (MULTI FOR HER PO), Take 1 tablet by mouth daily. , Disp: , Rfl:  .  Polyethylene Glycol 3350 (MIRALAX PO), Take 17 g by mouth daily as needed (constipation). , Disp: , Rfl:  .  pseudoephedrine (SUDAFED) 30 MG tablet, Take 30 mg by mouth every 4 (four) hours as needed for congestion., Disp: , Rfl:   Review of Systems:  Constitutional: Denies fever, chills, diaphoresis, appetite change and fatigue.  HEENT: Denies photophobia, eye pain, redness, hearing loss, ear pain, congestion, sore throat, rhinorrhea, sneezing, mouth sores, trouble swallowing, neck pain, neck stiffness and tinnitus.   Respiratory: Denies SOB, DOE, cough, chest tightness,  and wheezing.   Cardiovascular: Denies chest pain, palpitations and leg swelling.  Gastrointestinal: Denies nausea, vomiting, abdominal pain, diarrhea, constipation, blood in stool and abdominal distention.  Genitourinary: Denies dysuria, urgency, frequency, hematuria, flank pain and difficulty urinating.  Endocrine: Denies: hot or cold intolerance, sweats, changes in hair or nails, polyuria, polydipsia. Musculoskeletal: Positive for myalgias, back pain, joint swelling, arthralgias and gait problem.  Skin: Denies pallor, rash and wound.  Neurological: Denies dizziness, seizures, syncope, weakness, light-headedness, numbness and headaches.  Hematological: Denies adenopathy. Easy bruising, personal or family bleeding history  Psychiatric/Behavioral: Denies suicidal  ideation, mood changes, confusion, nervousness, sleep disturbance and agitation    Physical Exam: Vitals:   02/27/20 1123  BP: 118/70  Pulse: 75  Temp: 97.6 F (36.4 C)  TempSrc: Temporal  SpO2: 97%  Weight: 133 lb 4.8 oz (60.5 kg)    Body mass index is  20.27 kg/m.   Constitutional: NAD, calm, comfortable Eyes: PERRL, lids and conjunctivae normal, wears corrective lenses ENMT: Mucous membranes are moist. Respiratory: clear to auscultation bilaterally, no wheezing, no crackles. Normal respiratory effort. No accessory muscle use.  Cardiovascular: Regular rate and rhythm, no murmurs / rubs / gallops. No extremity edema.   Psychiatric: Normal judgment and insight. Alert and oriented x 3. Normal mood.    Impression and Plan:  Midline low back pain, unspecified chronicity, unspecified whether sciatica present  - Plan: Ambulatory referral to Physical Therapy  Lumbosacral radiculopathy at L4 -Suspect her back pain correlates to L4 nerve impingement given her right leg radiculopathy. -It is not severe, it does not significantly impair her function, she would prefer to try physical therapy first.  If no improvement can consider MRI and referral to neurosurgeon if indicated.  Left shoulder pain, unspecified chronicity  - Plan: Ambulatory referral to Physical Therapy      Usha Slager Isaac Bliss, MD [facility]

## 2020-03-03 ENCOUNTER — Other Ambulatory Visit: Payer: Self-pay

## 2020-03-03 ENCOUNTER — Ambulatory Visit: Payer: BC Managed Care – PPO | Attending: Internal Medicine

## 2020-03-03 DIAGNOSIS — G8929 Other chronic pain: Secondary | ICD-10-CM | POA: Insufficient documentation

## 2020-03-03 DIAGNOSIS — M545 Low back pain, unspecified: Secondary | ICD-10-CM

## 2020-03-03 DIAGNOSIS — M25612 Stiffness of left shoulder, not elsewhere classified: Secondary | ICD-10-CM

## 2020-03-03 DIAGNOSIS — M25512 Pain in left shoulder: Secondary | ICD-10-CM | POA: Insufficient documentation

## 2020-03-03 DIAGNOSIS — R252 Cramp and spasm: Secondary | ICD-10-CM | POA: Diagnosis present

## 2020-03-03 NOTE — Therapy (Signed)
[facility] [address] [place], [place], [address] Phone: [phone]   Fax:  [phone]  Physical Therapy Evaluation  Patient Details  Name: Terri Salazar MRN: [id number] Date of Birth: 06-Aug-1961 Referring Provider (PT): Rande Lawman, MD   Encounter Date: 03/03/2020  PT End of Session - 03/03/20 0930    Visit Number  1    Date for PT Re-Evaluation  04/28/20    Authorization Type  [employer]    Authorization - Visit Number  1    Authorization - Number of Visits  50    PT Start Time  0848    PT Stop Time  0931    PT Time Calculation (min)  43 min    Activity Tolerance  Patient tolerated treatment well    Behavior During Therapy  [facility] for tasks assessed/performed       Past Medical History:  Diagnosis Date  . Allergy   . Cancer ([place])   . Right bundle branch block    since 59 years old    Past Surgical History:  Procedure Laterality Date  . ABDOMINAL HYSTERECTOMY    . COLONOSCOPY    . ELBOW SURGERY Right   . LAPAROSCOPIC PARTIAL COLECTOMY N/A 09/13/2015   Procedure: LAPAROSCOPIC LOW ANTERIOR RESECTION OF RECTUM WITH MOBILIZATION OF SPLENIC FLEXURE;  Surgeon: Jackolyn Confer, MD;  Location: [facility];  Service: General;  Laterality: N/A;    There were no vitals filed for this visit.   Subjective Assessment - 03/03/20 0851    Subjective  Pt presents to PT with Lt shoulder pain and limited A/ROM after a fracture sustained 05/2019 when she fell from a ladder.  Pt has onset of Rt sided LBP that began ~6 weeks ago without cause.    Pertinent History  Colorectal cancer: 2016    How long can you sit comfortably?  need to be supine by 7pm    Diagnostic tests  X-ray: lumbar spine DDD.  Lt shoulder fracture-healed    Currently in Pain?  Yes    Pain Score  4     Pain Location  Back    Pain Orientation  Right    Pain Descriptors / Indicators  Nagging;Dull;Aching    Pain Type  Chronic pain    Pain Onset  More  than a month ago    Pain Frequency  Constant    Aggravating Factors   end of the day, constant with activity    Pain Relieving Factors  supine position    Multiple Pain Sites  Yes    Pain Score  0   up to 7/10 with use   Pain Location  Shoulder    Pain Orientation  Left    Pain Descriptors / Indicators  Sharp    Pain Onset  More than a month ago    Pain Frequency  Intermittent    Aggravating Factors   reaching, with movement behind the back    Pain Relieving Factors  stop the aggravating activity         [facility] PT Assessment - 03/03/20 0001      Assessment   Medical Diagnosis  midline low back pain, Lt shoulder pain    Referring Provider (PT)  Rande Lawman, MD    Onset Date/Surgical Date  05/17/19   LBP 6 weeks ago   Next MD Visit  none    Prior Therapy  none      Precautions   Precautions  Other (  comment)    Precaution Comments  history of colorectal cancer      Balance Screen   Has the patient fallen in the past 6 months  No    Has the patient had a decrease in activity level because of a fear of falling?   No    Is the patient reluctant to leave their home because of a fear of falling?   No      Home Film/video editor residence    Living Arrangements  Spouse/significant other;Children      Prior Function   Level of [place]  Unemployed    Leisure  walking for exercise, cleaning      Cognition   Overall Cognitive Status  Within Functional Limits for tasks assessed      Observation/Other Assessments   Focus on Therapeutic Outcomes (FOTO)   28% limitation      ROM / Strength   AROM / PROM / Strength  AROM;PROM;Strength      AROM   Overall AROM   Deficits    Overall AROM Comments  lumbar A/ROM is full with Rt lumbar pain and gluteal stiffness reported with lt sidebending and flexion    AROM Assessment Site  Shoulder    Right/Left Shoulder  Left    Left Shoulder Flexion  115 Degrees    Left Shoulder  ABduction  80 Degrees    Left Shoulder Internal Rotation  --   T10   Left Shoulder External Rotation  --   T3     PROM   Overall PROM   Deficits    PROM Assessment Site  Shoulder    Right/Left Shoulder  Left    Left Shoulder Flexion  130 Degrees    Left Shoulder ABduction  95 Degrees    Left Shoulder Internal Rotation  65 Degrees    Left Shoulder External Rotation  50 Degrees      Strength   Overall Strength  Within functional limits for tasks performed    Overall Strength Comments  4+/5 Lt shoulder strength, 5/5 bil Le strength      Palpation   Spinal mobility  normal spinal mobility with pain L3-5    Palpation comment  palpable tenderness over Rt quadratus, gluteals, Lt shoulder external rotators and lateral deltoid. Reduced shoulder mobility throughout                Objective measurements completed on examination: See above findings.              PT Education - 03/03/20 1104    Education Details  Access Code [facility]    Person(s) Educated  Patient    Methods  Explanation;Demonstration;Handout    Comprehension  Verbalized understanding;Returned demonstration       PT Short Term Goals - 03/03/20 0859      PT SHORT TERM GOAL #1   Title  be independent in initial HEP    Time  4    Period  Weeks    Status  New    Target Date  03/31/20      PT SHORT TERM GOAL #2   Title  demonstrate Lt shoulder A/ROM IR to T5 to improve bathing and dressing    Time  4    Period  Weeks    Status  New    Target Date  03/31/20      PT SHORT TERM GOAL #3   Title  report a 30% reduction in LBP daily tasks    Time  4    Period  Weeks    Status  New    Target Date  03/31/20        PT Long Term Goals - 03/03/20 1055      PT LONG TERM GOAL #1   Title  be indepdent in advanced HEP    Time  8    Period  Weeks    Status  New    Target Date  04/28/20      PT LONG TERM GOAL #2   Title  reduce FOTO to < or = to 19% limitation    Time  8    Period  Weeks     Status  New    Target Date  04/28/20      PT LONG TERM GOAL #3   Title  demonstrated Lt shoulder flexion A/ROM to > or to 150 degrees to imrpove overhead reaching    Time  8    Period  Weeks    Status  New    Target Date  04/28/20      PT LONG TERM GOAL #4   Title  report a 70% reduction in LBP with daily tasks and at the end of the day    Time  8    Status  New    Target Date  04/28/20      PT LONG TERM GOAL #5   Title  report unlimited use of the Lt UE due to improved functional A/ROM    Time  8    Period  Weeks    Status  New    Target Date  04/28/20             Plan - 03/03/20 1101    Clinical Impression Statement  Pt presents to PT with LBP and Lt shoulder pain/limited A/ROM.  Pt had a fracture to Lt shoulder 05/2019 when she fell from a ladder.  Pt reports limited A/ROM of the Lt shoulder since the injury.  Pt demonstrates Rt sided LBP with lumbar A/ROM and limited Lt shoulder A/ROM.  Pt with reduced Lt shoulder joint mobility and palpable tenderness over posterior joint and lateral deltoid.  Pt with tension and trigger points in Rt gluteals, quadratus and lumbar paraspinals.  Pt will benefit from skilled PT for core strength and flexibility, Lt shoulder flexibility and functional use.    Personal Factors and Comorbidities  Comorbidity 2    Comorbidities  low back pain, shoulder pain/fracture    Examination-Activity Limitations  Caring for Others;Carry;Dressing;Locomotion Level    Stability/Clinical Decision Making  Evolving/Moderate complexity    Clinical Decision Making  Moderate    Rehab Potential  Excellent    PT Frequency  2x / week    PT Duration  8 weeks    PT Treatment/Interventions  ADLs/Self Care Home Management;Cryotherapy;Software engineer;Therapeutic activities;Therapeutic exercise;Neuromuscular re-education;Patient/family education;Manual techniques;Taping;Dry needling;Passive range of motion;Vasopneumatic Device;Spinal  Manipulations    PT Next Visit Plan  dry needling to Rt gluteals and quadratus, ROM Lt shoulder, core strength       Patient will benefit from skilled therapeutic intervention in order to improve the following deficits and impairments:  Decreased activity tolerance, Decreased strength, Impaired flexibility, Impaired UE functional use, Improper body mechanics, Decreased range of motion, Increased muscle spasms, Decreased mobility  Visit Diagnosis: Chronic left shoulder pain - Plan: PT plan of care cert/re-cert  Stiffness of left shoulder, not  elsewhere classified - Plan: PT plan of care cert/re-cert  Chronic right-sided low back pain without sciatica - Plan: PT plan of care cert/re-cert  Cramp and spasm - Plan: PT plan of care cert/re-cert     Problem List Patient Active Problem List   Diagnosis Date Noted  . Fracture, humerus, greater tuberosity 09/11/2019  . Rectal cancer [facility]) s/p lap assisted LAR 09/13/15 09/13/2015     Sigurd Sos, PT 03/03/20 11:20 AM  [facility] [address] [place], [place], [address] Phone: [phone]   Fax:  [phone]  Name: Terri Salazar MRN: [id number] Date of Birth: 22-Nov-1960

## 2020-03-03 NOTE — Patient Instructions (Addendum)
Access Code: [email or web address] URL: [email or web address] Date: 03/03/2020 Prepared by: Claiborne Billings  Exercises Supine Lower Trunk Rotation - 3 x daily - 7 x weekly - 1 sets - 3 reps - 20 hold Hooklying Single Knee to Chest - 3 x daily - 7 x weekly - 3 reps - 1 sets - 20 hold Seated Hamstring Stretch - 3 x daily - 7 x weekly - 3 reps - 1 sets - 20 hold Seated Shoulder Flexion AAROM with Pulley Behind - 1 x daily - 7 x weekly - 10 reps - 3 sets Seated Shoulder Abduction AAROM with Pulley Behind - 1 x daily - 7 x weekly - 10 reps - 3 sets Standing Shoulder Internal Rotation AAROM with Pulley - 1 x daily - 7 x weekly - 10 reps - 3 sets Supine Shoulder Flexion AAROM with [place]el - 3 x daily - 7 x weekly - 3 sets - 10 reps Access Code [facility]  Trigger Point Dry Needling  . What is Trigger Point Dry Needling (DN)? o DN is a physical therapy technique used to treat muscle pain and dysfunction. Specifically, DN helps deactivate muscle trigger points (muscle knots).  o A thin filiform needle is used to penetrate the skin and stimulate the underlying trigger point. The goal is for a local twitch response (LTR) to occur and for the trigger point to relax. No medication of any kind is injected during the procedure.   . What Does Trigger Point Dry Needling Feel Like?  o The procedure feels different for each individual patient. Some patients report that they do not actually feel the needle enter the skin and overall the process is not painful. Very mild bleeding may occur. However, many patients feel a deep cramping in the muscle in which the needle was inserted. This is the local twitch response.   Marland Kitchen How Will I feel after the treatment? o Soreness is normal, and the onset of soreness may not occur for a few hours. Typically this soreness does not last longer than two days.  o Bruising is uncommon, however; ice can be used to decrease any possible bruising.  o In rare cases feeling tired or nauseous after  the treatment is normal. In addition, your symptoms may get worse before they get better, this period will typically not last longer than 24 hours.   . What Can I do After My Treatment? o Increase your hydration by drinking more water for the next 24 hours. o You may place ice or heat on the areas treated that have become sore, however, do not use heat on inflamed or bruised areas. Heat often brings more relief post needling. o You can continue your regular activities, but vigorous activity is not recommended initially after the treatment for 24 hours. o DN is best combined with other physical therapy such as strengthening, stretching, and other therapies.    [facility] [address], [place], [place] [address] Phone # [phone] Fax [phone]

## 2020-03-18 ENCOUNTER — Encounter: Payer: Self-pay | Admitting: Physical Therapy

## 2020-03-18 ENCOUNTER — Ambulatory Visit: Payer: BC Managed Care – PPO | Attending: Internal Medicine | Admitting: Physical Therapy

## 2020-03-18 ENCOUNTER — Other Ambulatory Visit: Payer: Self-pay

## 2020-03-18 DIAGNOSIS — R252 Cramp and spasm: Secondary | ICD-10-CM | POA: Insufficient documentation

## 2020-03-18 DIAGNOSIS — M545 Low back pain, unspecified: Secondary | ICD-10-CM

## 2020-03-18 DIAGNOSIS — M25512 Pain in left shoulder: Secondary | ICD-10-CM | POA: Insufficient documentation

## 2020-03-18 DIAGNOSIS — G8929 Other chronic pain: Secondary | ICD-10-CM | POA: Diagnosis present

## 2020-03-18 DIAGNOSIS — M25612 Stiffness of left shoulder, not elsewhere classified: Secondary | ICD-10-CM

## 2020-03-18 NOTE — Therapy (Signed)
[facility] [address], [place], [place], [address] Phone: [phone]   Fax:  [phone]  Physical Therapy Treatment  Patient Details  Name: Terri Salazar MRN: [id number] Date of Birth: 03-Apr-1961 Referring Provider (PT): Rande Lawman, MD   Encounter Date: 03/18/2020  PT End of Session - 03/18/20 0802    Visit Number  2    Date for PT Re-Evaluation  04/28/20    Authorization Type  BCBS    Authorization - Visit Number  2    Authorization - Number of Visits  50    PT Start Time  0800    PT Stop Time  0844    PT Time Calculation (min)  44 min    Activity Tolerance  Patient tolerated treatment well    Behavior During Therapy  [facility] for tasks assessed/performed       Past Medical History:  Diagnosis Date  . Allergy   . Cancer ([place])   . Right bundle branch block    since 59 years old    Past Surgical History:  Procedure Laterality Date  . ABDOMINAL HYSTERECTOMY    . COLONOSCOPY    . ELBOW SURGERY Right   . LAPAROSCOPIC PARTIAL COLECTOMY N/A 09/13/2015   Procedure: LAPAROSCOPIC LOW ANTERIOR RESECTION OF RECTUM WITH MOBILIZATION OF SPLENIC FLEXURE;  Surgeon: Jackolyn Confer, MD;  Location: [facility];  Service: General;  Laterality: N/A;    There were no vitals filed for this visit.  Subjective Assessment - 03/18/20 0804    Subjective  Patient with a little pinch in the left shoulder today. Rt low back and leg worsens as the day goes on.    Pertinent History  Colorectal cancer: 2016    Currently in Pain?  No/denies                       [facility] Adult PT Treatment/Exercise - 03/18/20 0001      Exercises   Exercises  Lumbar;Shoulder      Lumbar Exercises: Stretches   Active Hamstring Stretch  Right;Left;3 reps;20 seconds    Single Knee to Chest Stretch  Right;Left;3 reps;20 seconds    Lower Trunk Rotation  3 reps;20 seconds      Lumbar Exercises: Aerobic   Nustep  L2 x 6 min      Shoulder Exercises: Supine   Flexion  AAROM;Both;10 reps      Shoulder Exercises: Pulleys   Flexion  1 minute    Scaption  1 minute    Other Pulley Exercises  IR x 2 min   standing     Manual Therapy   Manual Therapy  Soft tissue mobilization    Manual therapy comments  skilled palpation and monitoring of soft tissues during DN    Soft tissue mobilization  to right gastroc, gluteals and lumbar       Trigger Point Dry Needling - 03/18/20 0001    Consent Given?  Yes    Education Handout Provided  Previously provided    Muscles Treated Lower Quadrant  Gastrocnemius    Muscles Treated Back/Hip  Gluteus minimus;Gluteus medius;Gluteus maximus;Piriformis;Quadratus lumborum    Dry Needling Comments  right    Gastrocnemius Response  Twitch response elicited;Palpable increased muscle length   lateral   Gluteus Minimus Response  Twitch response elicited;Palpable increased muscle length    Gluteus Medius Response  Twitch response elicited;Palpable increased muscle length    Gluteus Maximus Response  Twitch response elicited;Palpable  increased muscle length    Piriformis Response  Twitch response elicited;Palpable increased muscle length    Quadratus Lumborum Response  Palpable increased muscle length             PT Short Term Goals - 03/03/20 0859      PT SHORT TERM GOAL #1   Title  be independent in initial HEP    Time  4    Period  Weeks    Status  New    Target Date  03/31/20      PT SHORT TERM GOAL #2   Title  demonstrate Lt shoulder A/ROM IR to T5 to improve bathing and dressing    Time  4    Period  Weeks    Status  New    Target Date  03/31/20      PT SHORT TERM GOAL #3   Title  report a 30% reduction in LBP daily tasks    Time  4    Period  Weeks    Status  New    Target Date  03/31/20        PT Long Term Goals - 03/03/20 1055      PT LONG TERM GOAL #1   Title  be indepdent in advanced HEP    Time  8    Period  Weeks    Status  New    Target Date   04/28/20      PT LONG TERM GOAL #2   Title  reduce FOTO to < or = to 19% limitation    Time  8    Period  Weeks    Status  New    Target Date  04/28/20      PT LONG TERM GOAL #3   Title  demonstrated Lt shoulder flexion A/ROM to > or to 150 degrees to imrpove overhead reaching    Time  8    Period  Weeks    Status  New    Target Date  04/28/20      PT LONG TERM GOAL #4   Title  report a 70% reduction in LBP with daily tasks and at the end of the day    Time  8    Status  New    Target Date  04/28/20      PT LONG TERM GOAL #5   Title  report unlimited use of the Lt UE due to improved functional A/ROM    Time  8    Period  Weeks    Status  New    Target Date  04/28/20            Plan - 03/18/20 1743    Clinical Impression Statement  Pt presents after 2 week break from therapy. She hasn't been able to do her pulley exercises much due to limited time and space. She reports compliance with remaining HEP. All exercises were reviewed. Pt responded well to DN/manual in her right gluteals, lumbar and lateral gastroc with noticeable decrease in tissue tension noted. LTGs are ongoing.    Comorbidities  low back pain, shoulder pain/fracture    Examination-Activity Limitations  Caring for Others;Carry;Dressing;Locomotion Level    PT Frequency  2x / week    PT Duration  8 weeks    PT Treatment/Interventions  ADLs/Self Care Home Management;Cryotherapy;Software engineer;Therapeutic activities;Therapeutic exercise;Neuromuscular re-education;Patient/family education;Manual techniques;Taping;Dry needling;Passive range of motion;Vasopneumatic Device;Spinal Manipulations    PT Next Visit Plan  assess dry needling to  Rt gluteals, gastroc and quadratus, ROM Lt shoulder, core strength; may benefit from DN to shoulder.    PT Home Exercise Plan  [email or web address]    Consulted and Agree with Plan of Care  Patient       Patient will benefit from skilled therapeutic  intervention in order to improve the following deficits and impairments:  Decreased activity tolerance, Decreased strength, Impaired flexibility, Impaired UE functional use, Improper body mechanics, Decreased range of motion, Increased muscle spasms, Decreased mobility  Visit Diagnosis: Chronic left shoulder pain  Stiffness of left shoulder, not elsewhere classified  Chronic right-sided low back pain without sciatica  Cramp and spasm     Problem List Patient Active Problem List   Diagnosis Date Noted  . Fracture, humerus, greater tuberosity 09/11/2019  . Rectal cancer [facility]) s/p lap assisted LAR 09/13/15 09/13/2015    Madelyn Flavors PT 03/18/2020, 5:47 PM  [facility] [address], [place], [place], [address] Phone: [phone]   Fax:  [phone]  Name: Terri Salazar MRN: [id number] Date of Birth: 04/30/1961

## 2020-03-23 ENCOUNTER — Other Ambulatory Visit: Payer: Self-pay

## 2020-03-23 ENCOUNTER — Ambulatory Visit: Payer: BC Managed Care – PPO

## 2020-03-23 DIAGNOSIS — R252 Cramp and spasm: Secondary | ICD-10-CM

## 2020-03-23 DIAGNOSIS — M545 Low back pain, unspecified: Secondary | ICD-10-CM

## 2020-03-23 DIAGNOSIS — M25512 Pain in left shoulder: Secondary | ICD-10-CM

## 2020-03-23 DIAGNOSIS — G8929 Other chronic pain: Secondary | ICD-10-CM

## 2020-03-23 DIAGNOSIS — M25612 Stiffness of left shoulder, not elsewhere classified: Secondary | ICD-10-CM

## 2020-03-23 NOTE — Patient Instructions (Signed)
Access Code: [email or web address] URL: [email or web address] Date: 03/03/2020 Prepared by: Claiborne Billings  Exercises  Seated Figure 4 Piriformis Stretch - 2 x daily - 7 x weekly - 1 sets - 3 reps - 20 hold Standing Shoulder Internal Rotation Stretch with Towel - 1 x daily - 7 x weekly - 10 reps - 3 sets Clamshell - 2 x daily - 7 x weekly - 2 sets - 10 reps

## 2020-03-23 NOTE — Therapy (Signed)
[facility] [address], [place], [place], [address] Phone: [phone]   Fax:  [phone]  Physical Therapy Treatment  Patient Details  Name: Terri Salazar MRN: [id number] Date of Birth: 05/03/1961 Referring Provider (PT): Rande Lawman, MD   Encounter Date: 03/23/2020  PT End of Session - 03/23/20 0848    Visit Number  3    Date for PT Re-Evaluation  04/28/20    Authorization Type  BCBS    Authorization - Visit Number  3    Authorization - Number of Visits  50    PT Start Time  0802    PT Stop Time  0850    PT Time Calculation (min)  48 min    Activity Tolerance  Patient tolerated treatment well    Behavior During Therapy  [facility] for tasks assessed/performed       Past Medical History:  Diagnosis Date  . Allergy   . Cancer ([place])   . Right bundle branch block    since 59 years old    Past Surgical History:  Procedure Laterality Date  . ABDOMINAL HYSTERECTOMY    . COLONOSCOPY    . ELBOW SURGERY Right   . LAPAROSCOPIC PARTIAL COLECTOMY N/A 09/13/2015   Procedure: LAPAROSCOPIC LOW ANTERIOR RESECTION OF RECTUM WITH MOBILIZATION OF SPLENIC FLEXURE;  Surgeon: Jackolyn Confer, MD;  Location: [facility];  Service: General;  Laterality: N/A;    There were no vitals filed for this visit.  Subjective Assessment - 03/23/20 0805    Subjective  the needling really helped me.  I felt so good that I did too much.    Currently in Pain?  No/denies    Pain Score  --   6/10 LBP after a day of activity   Pain Location  Back    Pain Orientation  Right    Pain Descriptors / Indicators  Dull;Aching    Pain Type  Chronic pain    Pain Onset  More than a month ago    Pain Frequency  Constant    Aggravating Factors   end of the day, constant with activity    Pain Relieving Factors  supine position                       [facility] Adult PT Treatment/Exercise - 03/23/20 0001      Lumbar Exercises: Stretches    Single Knee to Chest Stretch  Right;Left;3 reps;20 seconds    Figure 4 Stretch  3 reps;20 seconds;Seated;With overpressure      Lumbar Exercises: Aerobic   Nustep  L2 x 8 min      Lumbar Exercises: Sidelying   Clam  Both;20 reps      Shoulder Exercises: Pulleys   Flexion  3 minutes      Shoulder Exercises: ROM/Strengthening   Other ROM/Strengthening Exercises  IR with towel x10      Manual Therapy   Manual Therapy  Soft tissue mobilization    Manual therapy comments  skilled palpation and monitoring of soft tissues during DN    Soft tissue mobilization  Rt gluteals and lumbar       Trigger Point Dry Needling - 03/23/20 0001    Consent Given?  Yes    Education Handout Provided  Previously provided    Muscles Treated Back/Hip  Gluteus minimus;Gluteus medius;Gluteus maximus;Piriformis;Quadratus lumborum;Lumbar multifidi    Dry Needling Comments  right    Gluteus Minimus Response  Twitch response  elicited;Palpable increased muscle length    Gluteus Medius Response  Twitch response elicited;Palpable increased muscle length    Gluteus Maximus Response  Twitch response elicited;Palpable increased muscle length    Piriformis Response  Twitch response elicited;Palpable increased muscle length    Lumbar multifidi Response  Twitch response elicited;Palpable increased muscle length   bil          PT Education - 03/23/20 0827    Education Details  Access Code: [email or web address]    Person(s) Educated  Patient    Methods  Explanation;Demonstration;Handout    Comprehension  Verbalized understanding;Returned demonstration       PT Short Term Goals - 03/23/20 0828      PT SHORT TERM GOAL #1   Title  be independent in initial HEP    Status  Achieved      PT SHORT TERM GOAL #3   Title  report a 30% reduction in LBP daily tasks    Time  4    Period  Weeks    Status  On-going        PT Long Term Goals - 03/03/20 1055      PT LONG TERM GOAL #1   Title  be indepdent in advanced  HEP    Time  8    Period  Weeks    Status  New    Target Date  04/28/20      PT LONG TERM GOAL #2   Title  reduce FOTO to < or = to 19% limitation    Time  8    Period  Weeks    Status  New    Target Date  04/28/20      PT LONG TERM GOAL #3   Title  demonstrated Lt shoulder flexion A/ROM to > or to 150 degrees to imrpove overhead reaching    Time  8    Period  Weeks    Status  New    Target Date  04/28/20      PT LONG TERM GOAL #4   Title  report a 70% reduction in LBP with daily tasks and at the end of the day    Time  8    Status  New    Target Date  04/28/20      PT LONG TERM GOAL #5   Title  report unlimited use of the Lt UE due to improved functional A/ROM    Time  8    Period  Weeks    Status  New    Target Date  04/28/20            Plan - 03/23/20 0831    Clinical Impression Statement  Pt is independent in HEP and PT added new HEP for flexibility and strength. Pt had good response to dry needling last session with relief of pain x 3 days.  Pt demonstrated improved Lt shoulder A/ROM and PT will measure next session.  Pt demonstrated improved tissue mobility and reduced tension today after dry needling.  Pt will continue to benefit from skilled PT to address Lt shoulder pain, flexibility and lumbar strength and flexibility.    PT Frequency  2x / week    PT Duration  8 weeks    PT Treatment/Interventions  ADLs/Self Care Home Management;Cryotherapy;Software engineer;Therapeutic activities;Therapeutic exercise;Neuromuscular re-education;Patient/family education;Manual techniques;Taping;Dry needling;Passive range of motion;Vasopneumatic Device;Spinal Manipulations    PT Next Visit Plan  assess response to dry needling, review new HEP, Lt shoulder flexibility and  measure    PT Home Exercise Plan  [email or web address]    Consulted and Agree with Plan of Care  Patient       Patient will benefit from skilled therapeutic intervention in order to  improve the following deficits and impairments:  Decreased activity tolerance, Decreased strength, Impaired flexibility, Impaired UE functional use, Improper body mechanics, Decreased range of motion, Increased muscle spasms, Decreased mobility  Visit Diagnosis: Chronic left shoulder pain  Stiffness of left shoulder, not elsewhere classified  Chronic right-sided low back pain without sciatica  Cramp and spasm     Problem List Patient Active Problem List   Diagnosis Date Noted  . Fracture, humerus, greater tuberosity 09/11/2019  . Rectal cancer [facility]) s/p lap assisted LAR 09/13/15 09/13/2015    Sigurd Sos, PT 03/23/20 8:54 AM  [facility] [address] [place], [place], [address] Phone: [phone]   Fax:  [phone]  Name: SIBELLA LAWE MRN: [id number] Date of Birth: September 29, 1961

## 2020-03-25 ENCOUNTER — Ambulatory Visit: Payer: BC Managed Care – PPO | Admitting: Physical Therapy

## 2020-03-25 ENCOUNTER — Other Ambulatory Visit: Payer: Self-pay

## 2020-03-25 ENCOUNTER — Encounter: Payer: Self-pay | Admitting: Physical Therapy

## 2020-03-25 DIAGNOSIS — M25512 Pain in left shoulder: Secondary | ICD-10-CM | POA: Diagnosis not present

## 2020-03-25 DIAGNOSIS — M545 Low back pain, unspecified: Secondary | ICD-10-CM

## 2020-03-25 DIAGNOSIS — G8929 Other chronic pain: Secondary | ICD-10-CM

## 2020-03-25 DIAGNOSIS — R252 Cramp and spasm: Secondary | ICD-10-CM

## 2020-03-25 DIAGNOSIS — M25612 Stiffness of left shoulder, not elsewhere classified: Secondary | ICD-10-CM

## 2020-03-25 NOTE — Therapy (Signed)
[facility] [address] [place], [place], [address] Phone: [phone]   Fax:  [phone]  Physical Therapy Treatment  Patient Details  Name: Terri Salazar MRN: [id number] Date of Birth: [birth year] Referring Provider (PT): Rande Lawman, MD   Encounter Date: 03/25/2020  PT End of Session - 03/25/20 1020    Visit Number  4    Date for PT Re-Evaluation  04/28/20    Authorization Type  BCBS    Authorization - Number of Visits  50    PT Start Time  1018    PT Stop Time  1103    PT Time Calculation (min)  45 min    Activity Tolerance  Patient tolerated treatment well    Behavior During Therapy  [facility] for tasks assessed/performed       Past Medical History:  Diagnosis Date  . Allergy   . Cancer ([place])   . Right bundle branch block    since 59 years old    Past Surgical History:  Procedure Laterality Date  . ABDOMINAL HYSTERECTOMY    . COLONOSCOPY    . ELBOW SURGERY Right   . LAPAROSCOPIC PARTIAL COLECTOMY N/A 09/13/2015   Procedure: LAPAROSCOPIC LOW ANTERIOR RESECTION OF RECTUM WITH MOBILIZATION OF SPLENIC FLEXURE;  Surgeon: Jackolyn Confer, MD;  Location: [facility];  Service: General;  Laterality: N/A;    There were no vitals filed for this visit.  Subjective Assessment - 03/25/20 1021    Subjective  Shoulder is bothering more than the back. Mainly with IR.    Diagnostic tests  X-ray: lumbar spine DDD.  Lt shoulder fracture-healed    Currently in Pain?  Yes    Pain Score  0-No pain    Pain Location  Back    Pain Score  1    Pain Location  Shoulder    Pain Orientation  Left    Pain Descriptors / Indicators  Sharp         [facility] PT Assessment - 03/25/20 0001      AROM   Left Shoulder Flexion  125 Degrees    Left Shoulder ABduction  90 Degrees    Left Shoulder Internal Rotation  40 Degrees   Thumb to T12     PROM   Left Shoulder Flexion  138 Degrees    Left Shoulder ABduction  105 Degrees     Left Shoulder Internal Rotation  45 Degrees    Left Shoulder External Rotation  54 Degrees                    [facility] Adult PT Treatment/Exercise - 03/25/20 0001      Shoulder Exercises: Supine   External Rotation  AAROM;15 reps    External Rotation Limitations  cane; elbow at side; also demo'd at 45 deg ABD    Flexion  AAROM;10 reps    ABduction  AAROM;Left;10 reps    ABduction Limitations  cane      Shoulder Exercises: ROM/Strengthening   UBE (Upper Arm Bike)  L2 X 4 2 min fwd/2 min bwd      Shoulder Exercises: Stretch   Internal Rotation Stretch  3 reps    Internal Rotation Stretch Limitations  AA with right UE    Other Shoulder Stretches  doorway stretch 3 levels left only;       Manual Therapy   Manual Therapy  Soft tissue mobilization;Passive ROM;Scapular mobilization    Manual therapy comments  skilled palpation  and monitoring of soft tissues during DN    Soft tissue mobilization  to left parascapular muscles post DN    Scapular Mobilization  in prone and supine    Passive ROM  into IR/ER       Trigger Point Dry Needling - 03/25/20 0001    Consent Given?  Yes    Education Handout Provided  Previously provided    Muscles Treated Head and Neck  Upper trapezius;Levator scapulae    Muscles Treated Upper Quadrant  Pectoralis major;Rhomboids;Infraspinatus;Subscapularis;Latissimus dorsi    Dry Needling Comments  left    Upper Trapezius Response  Twitch reponse elicited;Palpable increased muscle length    Levator Scapulae Response  Twitch response elicited;Palpable increased muscle length    Pectoralis Major Response  Palpable increased muscle length    Rhomboids Response  Palpable increased muscle length    Infraspinatus Response  Twitch response elicited;Palpable increased muscle length    Subscapularis Response  Twitch response elicited;Palpable increased muscle length    Latissimus dorsi Response  Twitch response elicited;Palpable increased muscle length            PT Education - 03/25/20 1102    Education Details  HEP PROGRESSED    Person(s) Educated  Patient    Methods  Explanation;Demonstration;Handout    Comprehension  Verbalized understanding       PT Short Term Goals - 03/25/20 1556      PT SHORT TERM GOAL #2   Title  demonstrate Lt shoulder A/ROM IR to T5 to improve bathing and dressing    Status  On-going        PT Long Term Goals - 03/03/20 1055      PT LONG TERM GOAL #1   Title  be indepdent in advanced HEP    Time  8    Period  Weeks    Status  New    Target Date  04/28/20      PT LONG TERM GOAL #2   Title  reduce FOTO to < or = to 19% limitation    Time  8    Period  Weeks    Status  New    Target Date  04/28/20      PT LONG TERM GOAL #3   Title  demonstrated Lt shoulder flexion A/ROM to > or to 150 degrees to imrpove overhead reaching    Time  8    Period  Weeks    Status  New    Target Date  04/28/20      PT LONG TERM GOAL #4   Title  report a 70% reduction in LBP with daily tasks and at the end of the day    Time  8    Status  New    Target Date  04/28/20      PT LONG TERM GOAL #5   Title  report unlimited use of the Lt UE due to improved functional A/ROM    Time  8    Period  Weeks    Status  New    Target Date  04/28/20            Plan - 03/25/20 1356    Clinical Impression Statement  Patient is showing some good improvements with ROM but is still limited in all planes. She has decreased scapular mobility as well.  She responded well to DN and STW with decrease in tissue tension noted. New ROM exercises were issued to improve ER and ABD.  Comorbidities  low back pain, shoulder pain/fracture    Examination-Activity Limitations  Caring for Others;Carry;Dressing;Locomotion Level    PT Treatment/Interventions  ADLs/Self Care Home Management;Cryotherapy;Software engineer;Therapeutic activities;Therapeutic exercise;Neuromuscular re-education;Patient/family  education;Manual techniques;Taping;Dry needling;Passive range of motion;Vasopneumatic Device;Spinal Manipulations    PT Next Visit Plan  assess response to dry needling, review new HEP, Lt shoulder ROM, scapular mobs    PT Home Exercise Plan  [facility]       Patient will benefit from skilled therapeutic intervention in order to improve the following deficits and impairments:  Decreased activity tolerance, Decreased strength, Impaired flexibility, Impaired UE functional use, Improper body mechanics, Decreased range of motion, Increased muscle spasms, Decreased mobility  Visit Diagnosis: Chronic left shoulder pain  Stiffness of left shoulder, not elsewhere classified  Chronic right-sided low back pain without sciatica  Cramp and spasm     Problem List Patient Active Problem List   Diagnosis Date Noted  . Fracture, humerus, greater tuberosity 09/11/2019  . Rectal cancer [facility]) s/p lap assisted LAR 09/13/15 09/13/2015    Madelyn Flavors PT 03/25/2020, 4:11 PM  [facility] [address], [place], [place], [address] Phone: [phone]   Fax:  [phone]  Name: Terri Salazar MRN: [id number] Date of Birth: [birth year]

## 2020-03-25 NOTE — Patient Instructions (Signed)
Access Code: [email or web address] URL: [email or web address] Date: 03/25/2020 Prepared by: Almyra Free Tari Lecount  Exercises Supine Lower Trunk Rotation - 3 x daily - 7 x weekly - 1 sets - 3 reps - 20 hold Hooklying Single Knee to Chest - 3 x daily - 7 x weekly - 3 reps - 1 sets - 20 hold Seated Hamstring Stretch - 3 x daily - 7 x weekly - 3 reps - 1 sets - 20 hold Supine Shoulder Flexion AAROM with Dowel - 3 x daily - 7 x weekly - 3 sets - 10 reps Seated Figure 4 Piriformis Stretch - 2 x daily - 7 x weekly - 1 sets - 3 reps - 20 hold Standing Shoulder Internal Rotation Stretch with Towel - 1 x daily - 7 x weekly - 10 reps - 3 sets Clamshell - 2 x daily - 7 x weekly - 2 sets - 10 reps Supine Shoulder External Rotation with Dowel - 1 x daily - 7 x weekly - 3 sets - 10 reps Supine Shoulder Abduction AAROM with Dowel - 1 x daily - 7 x weekly - 10 reps - 3 sets

## 2020-03-30 ENCOUNTER — Other Ambulatory Visit: Payer: Self-pay

## 2020-03-30 ENCOUNTER — Ambulatory Visit: Payer: BC Managed Care – PPO

## 2020-03-30 DIAGNOSIS — G8929 Other chronic pain: Secondary | ICD-10-CM

## 2020-03-30 DIAGNOSIS — M25612 Stiffness of left shoulder, not elsewhere classified: Secondary | ICD-10-CM

## 2020-03-30 DIAGNOSIS — M545 Low back pain, unspecified: Secondary | ICD-10-CM

## 2020-03-30 DIAGNOSIS — R252 Cramp and spasm: Secondary | ICD-10-CM

## 2020-03-30 DIAGNOSIS — M25512 Pain in left shoulder: Secondary | ICD-10-CM | POA: Diagnosis not present

## 2020-03-30 NOTE — Therapy (Signed)
[facility] [address], [place], [place], [address] Phone: [phone]   Fax:  [phone]  Physical Therapy Treatment  Patient Details  Name: Terri Salazar MRN: [id number] Date of Birth: November 30, 1960 Referring Provider (PT): Rande Lawman, MD   Encounter Date: 03/30/2020  PT End of Session - 03/30/20 0850    Visit Number  5    Date for PT Re-Evaluation  04/28/20    Authorization Type  BCBS    Authorization - Visit Number  5    Authorization - Number of Visits  50    PT Start Time  0800    PT Stop Time  0845    PT Time Calculation (min)  45 min    Activity Tolerance  Patient tolerated treatment well       Past Medical History:  Diagnosis Date  . Allergy   . Cancer ([place])   . Right bundle branch block    since 59 years old    Past Surgical History:  Procedure Laterality Date  . ABDOMINAL HYSTERECTOMY    . COLONOSCOPY    . ELBOW SURGERY Right   . LAPAROSCOPIC PARTIAL COLECTOMY N/A 09/13/2015   Procedure: LAPAROSCOPIC LOW ANTERIOR RESECTION OF RECTUM WITH MOBILIZATION OF SPLENIC FLEXURE;  Surgeon: Jackolyn Confer, MD;  Location: [facility];  Service: General;  Laterality: N/A;    There were no vitals filed for this visit.  Subjective Assessment - 03/30/20 0802    Diagnostic tests  X-ray: lumbar spine DDD.  Lt shoulder fracture-healed         [facility] PT Assessment - 03/30/20 0001      AROM   Left Shoulder Flexion  125 Degrees    Left Shoulder Internal Rotation  --   T8                   [facility] Adult PT Treatment/Exercise - 03/30/20 0001      Lumbar Exercises: Aerobic   Nustep  L2 x 8 min      Lumbar Exercises: Sidelying   Clam  Both;20 reps      Shoulder Exercises: Supine   External Rotation  AAROM;15 reps    External Rotation Limitations  cane; elbow at side; also demo'd at 45 deg ABD      Shoulder Exercises: Sidelying   External Rotation  Strengthening;Left;20 reps        Trigger Point Dry Needling - 03/30/20 0001    Consent Given?  Yes    Education Handout Provided  Previously provided    Muscles Treated Head and Neck  Upper trapezius   Lt only   Muscles Treated Upper Quadrant  Rhomboids;Infraspinatus;Subscapularis   Lt only   Muscles Treated Back/Hip  Gluteus minimus;Gluteus medius;Gluteus maximus;Piriformis;Quadratus lumborum;Lumbar multifidi    Upper Trapezius Response  Twitch reponse elicited;Palpable increased muscle length    Rhomboids Response  Palpable increased muscle length    Infraspinatus Response  Twitch response elicited;Palpable increased muscle length    Subscapularis Response  Twitch response elicited;Palpable increased muscle length    Gluteus Minimus Response  Twitch response elicited;Palpable increased muscle length    Gluteus Medius Response  Twitch response elicited;Palpable increased muscle length    Gluteus Maximus Response  Twitch response elicited;Palpable increased muscle length    Piriformis Response  Twitch response elicited;Palpable increased muscle length             PT Short Term Goals - 03/30/20 0804      PT SHORT  TERM GOAL #3   Title  report a 30% reduction in LBP daily tasks    Baseline  70% better    Status  Achieved        PT Long Term Goals - 03/30/20 0806      PT LONG TERM GOAL #1   Title  be indepdent in advanced HEP    Time  8    Period  Weeks    Status  On-going      PT LONG TERM GOAL #4   Title  report a 70% reduction in LBP with daily tasks and at the end of the day    Baseline  50% at the end of the day, 70% overall    Time  8    Period  Weeks    Status  On-going            Plan - 03/30/20 0815    Clinical Impression Statement  Pt reports 70% overall improvement in LBP since the start of care and 50% at the end of the day.  Pt is working to improve Lt shoulder ROM with HEP. Pt demonstrated good technique with ER A/AROM without cueing needed.  Pt with tension and trigger  points in Lt shoulder posterior musculature and Rt gluteals.  Pt demonstrated improved tissue mobility and reduced pain after manual therapy and dry needling today.  Pt will continue to benefit from skilled PT to address Lt shoulder and Rt lumbar/gluteal pain.    PT Frequency  2x / week    PT Duration  8 weeks    PT Treatment/Interventions  ADLs/Self Care Home Management;Cryotherapy;Software engineer;Therapeutic activities;Therapeutic exercise;Neuromuscular re-education;Patient/family education;Manual techniques;Taping;Dry needling;Passive range of motion;Vasopneumatic Device;Spinal Manipulations    PT Next Visit Plan  assess response to dry needling,  Lt shoulder ROM, scapular mobs, core and gluteal strength    PT Home Exercise Plan  [id number]    Consulted and Agree with Plan of Care  Patient       Patient will benefit from skilled therapeutic intervention in order to improve the following deficits and impairments:  Decreased activity tolerance, Decreased strength, Impaired flexibility, Impaired UE functional use, Improper body mechanics, Decreased range of motion, Increased muscle spasms, Decreased mobility  Visit Diagnosis: Chronic left shoulder pain  Stiffness of left shoulder, not elsewhere classified  Chronic right-sided low back pain without sciatica  Cramp and spasm     Problem List Patient Active Problem List   Diagnosis Date Noted  . Fracture, humerus, greater tuberosity 09/11/2019  . Rectal cancer [facility]) s/p lap assisted LAR 09/13/15 09/13/2015     Sigurd Sos, PT 03/30/20 8:53 AM  [facility] [address] [place], [place], [address] Phone: [phone]   Fax:  [phone]  Name: Terri Salazar MRN: [id number] Date of Birth: 08/12/1961

## 2020-04-01 ENCOUNTER — Other Ambulatory Visit: Payer: Self-pay

## 2020-04-01 ENCOUNTER — Encounter: Payer: Self-pay | Admitting: Physical Therapy

## 2020-04-01 ENCOUNTER — Ambulatory Visit: Payer: BC Managed Care – PPO | Admitting: Physical Therapy

## 2020-04-01 DIAGNOSIS — M545 Low back pain, unspecified: Secondary | ICD-10-CM

## 2020-04-01 DIAGNOSIS — M25612 Stiffness of left shoulder, not elsewhere classified: Secondary | ICD-10-CM

## 2020-04-01 DIAGNOSIS — R252 Cramp and spasm: Secondary | ICD-10-CM

## 2020-04-01 DIAGNOSIS — M25512 Pain in left shoulder: Secondary | ICD-10-CM

## 2020-04-01 DIAGNOSIS — G8929 Other chronic pain: Secondary | ICD-10-CM

## 2020-04-01 NOTE — Therapy (Signed)
[facility] [address] [place], [place], [address] Phone: [phone]   Fax:  [phone]  Physical Therapy Treatment  Patient Details  Name: Terri Salazar MRN: [id number] Date of Birth: 1961/10/20 Referring Provider (PT): Rande Lawman, MD   Encounter Date: 04/01/2020  PT End of Session - 04/01/20 0846    Visit Number  6    Date for PT Re-Evaluation  04/28/20    Authorization Type  BCBS    Authorization - Visit Number  5    Authorization - Number of Visits  50    PT Start Time  0800    PT Stop Time  [id number]    PT Time Calculation (min)  44 min    Activity Tolerance  Patient tolerated treatment well;No increased pain    Behavior During Therapy  WFL for tasks assessed/performed       Past Medical History:  Diagnosis Date  . Allergy   . Cancer ([place])   . Right bundle branch block    since 59 years old    Past Surgical History:  Procedure Laterality Date  . ABDOMINAL HYSTERECTOMY    . COLONOSCOPY    . ELBOW SURGERY Right   . LAPAROSCOPIC PARTIAL COLECTOMY N/A 09/13/2015   Procedure: LAPAROSCOPIC LOW ANTERIOR RESECTION OF RECTUM WITH MOBILIZATION OF SPLENIC FLEXURE;  Surgeon: Jackolyn Confer, MD;  [facility]: [facility];  Service: General;  Laterality: N/A;    There were no vitals filed for this visit.  Subjective Assessment - 04/01/20 0800    Subjective  Pt states that she is doing well today. No pain currently. She is doing her exercises.    Diagnostic tests  X-ray: lumbar spine DDD.  Lt shoulder fracture-healed    Currently in Pain?  No/denies                        [facility] Adult PT Treatment/Exercise - 04/01/20 0001      Shoulder Exercises: Supine   External Rotation  Strengthening;Left;10 reps    External Rotation Limitations  x2 reps; #2 weight eccentric    Flexion  AROM;Left;10 reps    Flexion Limitations  x2 sets, #2 dumbbell in hand for eccentric stretch       Shoulder  Exercises: Standing   Extension  AAROM;Both;15 reps    Extension Limitations  holding stick      Shoulder Exercises: ROM/Strengthening   Other ROM/Strengthening Exercises  shoulder flexion slide on foam roll against wall x15 reps     Other ROM/Strengthening Exercises  scap protraction x15 reps hands on wall 90 deg elevation      Manual Therapy   Manual therapy comments  Lt GH AP mobilization grade III-IV 3x30 sec bouts arm in 45 deg abduction and 90 deg abduction    Scapular Mobilization  In sidelying Lt scap mobs all directions     Passive ROM  Lt shoulder horizontal abduction: PT blocking scapula              PT Education - 04/01/20 0844    Education Details  technique with therex    Person(s) Educated  Patient    Methods  Explanation;Verbal cues    Comprehension  Verbalized understanding;Returned demonstration       PT Short Term Goals - 03/30/20 0804      PT SHORT TERM GOAL #3   Title  report a 30% reduction in LBP daily tasks    Baseline  70%  better    Status  Achieved        PT Long Term Goals - 03/30/20 0806      PT LONG TERM GOAL #1   Title  be indepdent in advanced HEP    Time  8    Period  Weeks    Status  On-going      PT LONG TERM GOAL #4   Title  report a 70% reduction in LBP with daily tasks and at the end of the day    Baseline  50% at the end of the day, 70% overall    Time  8    Period  Weeks    Status  On-going            Plan - 04/01/20 0846    Clinical Impression Statement  Today's session continued with focus on shoulder. Pt has noticed improvements in her ROM after adjusting her HEP last session. She is limited with reaching behind her back. PT completed manual treatment to increase posterior shoulder capsule flexibility. Also introduced exercises to improve scapula neuromuscular control with tactile cuing during upward rotation and protraction. Pt struggled with this initially but was able to complete the exercises and stretches  without increase in pain end of session. Will continue with current POC.    PT Frequency  2x / week    PT Duration  8 weeks    PT Treatment/Interventions  ADLs/Self Care Home Management;Cryotherapy;Software engineer;Therapeutic activities;Therapeutic exercise;Neuromuscular re-education;Patient/family education;Manual techniques;Taping;Dry needling;Passive range of motion;Vasopneumatic Device;Spinal Manipulations    PT Next Visit Plan  MMT Lt shoulder; Lt shoulder ROM and possibly add eccentric stretches if no issues last session, core and gluteal strength    PT Home Exercise Plan  [id number]    Consulted and Agree with Plan of Care  Patient       Patient will benefit from skilled therapeutic intervention in order to improve the following deficits and impairments:  Decreased activity tolerance, Decreased strength, Impaired flexibility, Impaired UE functional use, Improper body mechanics, Decreased range of motion, Increased muscle spasms, Decreased mobility  Visit Diagnosis: Chronic left shoulder pain  Stiffness of left shoulder, not elsewhere classified  Chronic right-sided low back pain without sciatica  Cramp and spasm     Problem List Patient Active Problem List   Diagnosis Date Noted  . Fracture, humerus, greater tuberosity 09/11/2019  . Rectal cancer [facility]) s/p lap assisted LAR 09/13/15 09/13/2015    8:51 AM,04/01/20 [name] PT, DPT [place] [facility] [address], [place], [place], [address] Phone: [phone]   Fax:  [phone]  Name: TAMARIAH PEHL MRN: [id number] Date of Birth: [birth year]

## 2020-04-06 ENCOUNTER — Encounter: Payer: Self-pay | Admitting: Physical Therapy

## 2020-04-06 ENCOUNTER — Ambulatory Visit: Payer: BC Managed Care – PPO | Admitting: Physical Therapy

## 2020-04-06 ENCOUNTER — Other Ambulatory Visit: Payer: Self-pay

## 2020-04-06 DIAGNOSIS — M25512 Pain in left shoulder: Secondary | ICD-10-CM

## 2020-04-06 DIAGNOSIS — M25612 Stiffness of left shoulder, not elsewhere classified: Secondary | ICD-10-CM

## 2020-04-06 DIAGNOSIS — R252 Cramp and spasm: Secondary | ICD-10-CM

## 2020-04-06 DIAGNOSIS — G8929 Other chronic pain: Secondary | ICD-10-CM

## 2020-04-06 DIAGNOSIS — M545 Low back pain, unspecified: Secondary | ICD-10-CM

## 2020-04-06 NOTE — Patient Instructions (Signed)
Access Code: [email or web address]: 05/25/2021Prepared by: [facility]  Supine Lower Trunk Rotation - 3 x daily - 7 x weekly - 1 sets - 3 reps - 20 hold  Hooklying Single Knee to Chest - 3 x daily - 7 x weekly - 3 reps - 1 sets - 20 hold  Seated Hamstring Stretch - 3 x daily - 7 x weekly - 3 reps - 1 sets - 20 hold  Seated Figure 4 Piriformis Stretch - 2 x daily - 7 x weekly - 1 sets - 3 reps - 20 hold  Standing Shoulder Internal Rotation Stretch with Towel - 1 x daily - 7 x weekly - 10 reps - 3 sets  Clamshell - 2 x daily - 7 x weekly - 2 sets - 10 reps  Supine Shoulder Abduction AAROM with Dowel - 1 x daily - 7 x weekly - 10 reps - 3 sets  Standing Single Arm Shoulder Flexion Stretch on Wall - 1 x daily - 7 x weekly - 2 sets - 10 reps - 5 seconds hold  Standing Shoulder External Rotation AAROM with Dowel - 1 x daily - 7 x weekly - 2 sets - 10 reps - 5 seconds hold  Standing Shoulder Abduction Slides at Wall - 1 x daily - 7 x weekly - 2 sets - 10 reps - 5 seconds hold   [facility] [address] [place], [place] [address] Phone # [phone] Fax [phone]

## 2020-04-06 NOTE — Therapy (Signed)
[facility] [address], [place], [place], [address] Phone: [phone]   Fax:  [phone]  Physical Therapy Treatment  Patient Details  Name: Terri Salazar MRN: [id number] Date of Birth: [birth year] Referring Provider (PT): Rande Lawman, MD   Encounter Date: 04/06/2020  PT End of Session - 04/06/20 0843    Visit Number  7    Date for PT Re-Evaluation  04/28/20    Authorization Type  BCBS    Authorization - Visit Number  7    Authorization - Number of Visits  50    PT Start Time  0801    PT Stop Time  [id number]    PT Time Calculation (min)  42 min    Activity Tolerance  Patient tolerated treatment well;No increased pain    Behavior During Therapy  WFL for tasks assessed/performed       Past Medical History:  Diagnosis Date  . Allergy   . Cancer ([place])   . Right bundle branch block    since 59 years old    Past Surgical History:  Procedure Laterality Date  . ABDOMINAL HYSTERECTOMY    . COLONOSCOPY    . ELBOW SURGERY Right   . LAPAROSCOPIC PARTIAL COLECTOMY N/A 09/13/2015   Procedure: LAPAROSCOPIC LOW ANTERIOR RESECTION OF RECTUM WITH MOBILIZATION OF SPLENIC FLEXURE;  Surgeon: Jackolyn Confer, MD;  [facility]: [facility];  Service: General;  Laterality: N/A;    There were no vitals filed for this visit.  Subjective Assessment - 04/06/20 0802    Subjective  Pt states that things are going well. She was a little sore following her last session, but it is good now.    Diagnostic tests  X-ray: lumbar spine DDD.  Lt shoulder fracture-healed    Currently in Pain?  No/denies         [facility] PT Assessment - 04/06/20 0001      Strength   Overall Strength Comments  5/5 MMT except Lt external rotation 4/5 MMT (+) pain                     [facility] Adult PT Treatment/Exercise - 04/06/20 0001      Shoulder Exercises: Standing   External Rotation  AROM;Both;10 reps    External Rotation Limitations  2  sets, back against wall, between 45 deg and 90 deg abduction    Other Standing Exercises  self trigger point release Lt posterior shoulder with tennis ball on wall       Shoulder Exercises: ROM/Strengthening   [place]  L33: Lt shoulder flexion and abduction x10 reps with end range pause     Ball on Wall  Lt UE clockwise and counterclockwise 2x30 sec       Manual Therapy   Soft tissue mobilization  Lt infraspinatus and teres minor; with trigger point release to both              PT Education - 04/06/20 0838    Education Details  technique with therex    Person(s) Educated  Patient    Methods  Explanation;Verbal cues;Handout    Comprehension  Verbalized understanding;Returned demonstration       PT Short Term Goals - 03/30/20 0804      PT SHORT TERM GOAL #3   Title  report a 30% reduction in LBP daily tasks    Baseline  70% better    Status  Achieved        PT Long  Term Goals - 03/30/20 0806      PT LONG TERM GOAL #1   Title  be indepdent in advanced HEP    Time  8    Period  Weeks    Status  On-going      PT LONG TERM GOAL #4   Title  report a 70% reduction in LBP with daily tasks and at the end of the day    Baseline  50% at the end of the day, 70% overall    Time  8    Period  Weeks    Status  On-going            Plan - 04/06/20 0844    Clinical Impression Statement  Pt had mild soreness following last session but felt improvements in her shoulder flexibility during functional activity. She has 4/5 MMT of Lt shoulder external rotation with palpable trigger points on the infraspinatus and teres minor. Pt completed manual treatment to the area to decrease muscle spasm. Pt struggled with active shoulder external rotation against the wall, but this was significantly improved following active assisted and active exercise. HEP was updated to reflect improvements in her shoulder ROM. Will continue with current POC.    PT Frequency  2x / week    PT Duration  8  weeks    PT Treatment/Interventions  ADLs/Self Care Home Management;Cryotherapy;Software engineer;Therapeutic activities;Therapeutic exercise;Neuromuscular re-education;Patient/family education;Manual techniques;Taping;Dry needling;Passive range of motion;Vasopneumatic Device;Spinal Manipulations    PT Next Visit Plan  Lt shoulder ER end range work;  Lt shoulder ROM and possibly add eccentric stretches if no issues last session, core and gluteal strength    PT Home Exercise Plan  [id number]    Consulted and Agree with Plan of Care  Patient       Patient will benefit from skilled therapeutic intervention in order to improve the following deficits and impairments:  Decreased activity tolerance, Decreased strength, Impaired flexibility, Impaired UE functional use, Improper body mechanics, Decreased range of motion, Increased muscle spasms, Decreased mobility  Visit Diagnosis: Chronic left shoulder pain  Stiffness of left shoulder, not elsewhere classified  Chronic right-sided low back pain without sciatica  Cramp and spasm     Problem List Patient Active Problem List   Diagnosis Date Noted  . Fracture, humerus, greater tuberosity 09/11/2019  . Rectal cancer [facility]) s/p lap assisted LAR 09/13/15 09/13/2015    10:13 AM,04/06/20 [name] PT, DPT [place] [facility] [address] [place], [place], [address] Phone: [phone]   Fax:  [phone]  Name: Terri Salazar MRN: [id number] Date of Birth: [birth year]

## 2020-04-08 ENCOUNTER — Encounter: Payer: Self-pay | Admitting: Physical Therapy

## 2020-04-08 ENCOUNTER — Ambulatory Visit: Payer: BC Managed Care – PPO | Admitting: Physical Therapy

## 2020-04-08 ENCOUNTER — Other Ambulatory Visit: Payer: Self-pay

## 2020-04-08 DIAGNOSIS — M25512 Pain in left shoulder: Secondary | ICD-10-CM | POA: Diagnosis not present

## 2020-04-08 DIAGNOSIS — G8929 Other chronic pain: Secondary | ICD-10-CM

## 2020-04-08 DIAGNOSIS — M545 Low back pain, unspecified: Secondary | ICD-10-CM

## 2020-04-08 DIAGNOSIS — R252 Cramp and spasm: Secondary | ICD-10-CM

## 2020-04-08 DIAGNOSIS — M25612 Stiffness of left shoulder, not elsewhere classified: Secondary | ICD-10-CM

## 2020-04-08 NOTE — Therapy (Signed)
[facility] [address] [place], [place], [address] Phone: [phone]   Fax:  [phone]  Physical Therapy Treatment  Patient Details  Name: Terri Salazar MRN: [id number] Date of Birth: 1961/06/19 Referring Provider (PT): Rande Lawman, MD   Encounter Date: 04/08/2020  PT End of Session - 04/08/20 0844    Visit Number  8    Date for PT Re-Evaluation  04/28/20    Authorization Type  BCBS    Authorization - Visit Number  8    Authorization - Number of Visits  50    PT Start Time  0800    PT Stop Time  [id number]    PT Time Calculation (min)  43 min    Activity Tolerance  Patient tolerated treatment well;No increased pain    Behavior During Therapy  WFL for tasks assessed/performed       Past Medical History:  Diagnosis Date  . Allergy   . Cancer ([place])   . Right bundle branch block    since 59 years old    Past Surgical History:  Procedure Laterality Date  . ABDOMINAL HYSTERECTOMY    . COLONOSCOPY    . ELBOW SURGERY Right   . LAPAROSCOPIC PARTIAL COLECTOMY N/A 09/13/2015   Procedure: LAPAROSCOPIC LOW ANTERIOR RESECTION OF RECTUM WITH MOBILIZATION OF SPLENIC FLEXURE;  Surgeon: Jackolyn Confer, MD;  [facility]: [facility];  Service: General;  Laterality: N/A;    There were no vitals filed for this visit.  Subjective Assessment - 04/08/20 0801    Subjective  Pt states things are going well. No pain currently.    Diagnostic tests  X-ray: lumbar spine DDD.  Lt shoulder fracture-healed    Currently in Pain?  No/denies                        [facility] Adult PT Treatment/Exercise - 04/08/20 0001      Shoulder Exercises: Supine   Flexion  AROM;Left;10 reps    Shoulder Flexion Weight (lbs)  2    Flexion Limitations  x2 sets; end range stretch       Shoulder Exercises: Sidelying   External Rotation  Strengthening;Left;10 reps    External Rotation Weight (lbs)  1    External Rotation Limitations   2 sets      Shoulder Exercises: Standing   Flexion  Both;5 reps    Flexion Limitations  yellow TB around wrists- end range lift off x3 sets     Other Standing Exercises  snow angle with reach over at 90 deg elevation x10 reps; Lt UE 3 way reach against wall - yellow TB around forearm x10 reps       Manual Therapy   Soft tissue mobilization  Lt latissimus,teres major/minor during passive Lt shoulder flexion    Passive ROM  Lt shoulder horizontal abduction: PT blocking scapula                PT Short Term Goals - 03/30/20 0804      PT SHORT TERM GOAL #3   Title  report a 30% reduction in LBP daily tasks    Baseline  70% better    Status  Achieved        PT Long Term Goals - 04/08/20 0844      PT LONG TERM GOAL #1   Title  be indepdent in advanced HEP    Time  8    Period  Weeks  Status  New      PT LONG TERM GOAL #2   Title  reduce FOTO to < or = to 19% limitation    Time  8    Period  Weeks    Status  New      PT LONG TERM GOAL #3   Title  demonstrated Lt shoulder flexion A/ROM to > or to 150 degrees to imrpove overhead reaching    Time  8    Period  Weeks    Status  Achieved      PT LONG TERM GOAL #4   Title  report a 70% reduction in LBP with daily tasks and at the end of the day    Time  8    Status  Achieved      PT LONG TERM GOAL #5   Title  report unlimited use of the Lt UE due to improved functional A/ROM    Time  8    Period  Weeks    Status  New            Plan - 04/08/20 [id number]    Clinical Impression Statement  Pt is doing well with recent HEP updates, reporting improved overall flexibility with daily tasks. She lacks approximately 10 deg of Lt shoulder flexion but this is improving with soft tissue work and active stretching to the posterior shoulder musculature. Pt was able to progress Lt shoulder external rotation strengthening today without increase in pain and had improved ROM compared to her last session. Pt's HEP was further  updated to reflect changes in ROM and strength, and she demonstrated good understanding of this.    PT Frequency  2x / week    PT Duration  8 weeks    PT Treatment/Interventions  ADLs/Self Care Home Management;Cryotherapy;Software engineer;Therapeutic activities;Therapeutic exercise;Neuromuscular re-education;Patient/family education;Manual techniques;Taping;Dry needling;Passive range of motion;Vasopneumatic Device;Spinal Manipulations    PT Next Visit Plan  Lt shoulder IR ROM and strengthening; f/u on reach behind back, core and gluteal strength    PT Home Exercise Plan  [email or web address]    Consulted and Agree with Plan of Care  Patient       Patient will benefit from skilled therapeutic intervention in order to improve the following deficits and impairments:  Decreased activity tolerance, Decreased strength, Impaired flexibility, Impaired UE functional use, Improper body mechanics, Decreased range of motion, Increased muscle spasms, Decreased mobility  Visit Diagnosis: Chronic left shoulder pain  Stiffness of left shoulder, not elsewhere classified  Chronic right-sided low back pain without sciatica  Cramp and spasm     Problem List Patient Active Problem List   Diagnosis Date Noted  . Fracture, humerus, greater tuberosity 09/11/2019  . Rectal cancer [facility]) s/p lap assisted LAR 09/13/15 09/13/2015    8:48 AM,04/08/20 [name] PT, DPT [place] [facility] [address] [place], [place], [address] Phone: [phone]   Fax:  [phone]  Name: Terri Salazar MRN: [id number] Date of Birth: 26-Aug-1961

## 2020-04-13 ENCOUNTER — Other Ambulatory Visit: Payer: Self-pay

## 2020-04-13 ENCOUNTER — Encounter: Payer: Self-pay | Admitting: Physical Therapy

## 2020-04-13 ENCOUNTER — Ambulatory Visit: Payer: BC Managed Care – PPO | Attending: Internal Medicine | Admitting: Physical Therapy

## 2020-04-13 DIAGNOSIS — M25512 Pain in left shoulder: Secondary | ICD-10-CM | POA: Diagnosis present

## 2020-04-13 DIAGNOSIS — M25612 Stiffness of left shoulder, not elsewhere classified: Secondary | ICD-10-CM | POA: Diagnosis present

## 2020-04-13 DIAGNOSIS — M545 Low back pain, unspecified: Secondary | ICD-10-CM

## 2020-04-13 DIAGNOSIS — G8929 Other chronic pain: Secondary | ICD-10-CM

## 2020-04-13 DIAGNOSIS — R252 Cramp and spasm: Secondary | ICD-10-CM | POA: Diagnosis present

## 2020-04-13 NOTE — Therapy (Signed)
[facility] [address], [place], [place], [address] Phone: [phone]   Fax:  [phone]  Physical Therapy Treatment  Patient Details  Name: Terri Salazar MRN: [id number] Date of Birth: 1961/10/21 Referring Provider (PT): Rande Lawman, MD   Encounter Date: 04/13/2020  PT End of Session - 04/13/20 0922    Visit Number  9    Date for PT Re-Evaluation  04/28/20    Authorization Type  BCBS    Authorization - Visit Number  9    Authorization - Number of Visits  50    PT Start Time  0845    PT Stop Time  [id number]    PT Time Calculation (min)  40 min    Activity Tolerance  Patient tolerated treatment well;No increased pain    Behavior During Therapy  WFL for tasks assessed/performed       Past Medical History:  Diagnosis Date  . Allergy   . Cancer ([place])   . Right bundle branch block    since 59 years old    Past Surgical History:  Procedure Laterality Date  . ABDOMINAL HYSTERECTOMY    . COLONOSCOPY    . ELBOW SURGERY Right   . LAPAROSCOPIC PARTIAL COLECTOMY N/A 09/13/2015   Procedure: LAPAROSCOPIC LOW ANTERIOR RESECTION OF RECTUM WITH MOBILIZATION OF SPLENIC FLEXURE;  Surgeon: Jackolyn Confer, MD;  Location: [facility];  Service: General;  Laterality: N/A;    There were no vitals filed for this visit.  Subjective Assessment - 04/13/20 0850    Subjective  Pt states things are going well. No complaints at this time.    Diagnostic tests  X-ray: lumbar spine DDD.  Lt shoulder fracture-healed    Currently in Pain?  No/denies                        [facility] Adult PT Treatment/Exercise - 04/13/20 0001      Shoulder Exercises: Standing   Internal Rotation  Strengthening;Left;15 reps    Internal Rotation Limitations  1 set elbow by side    Flexion  AROM;Left;15 reps    Flexion Limitations  Lt flexion slide with end range lift off hold 3 sec       Shoulder Exercises: ROM/Strengthening   [place]   L34: Lt shoulder flexion x10 reps, x10 reps 10 sec hold       Manual Therapy   Manual Therapy  Joint mobilization    Joint Mobilization  Lt glenohumeral mobilization grade III-IV inferior at end range flexion x3 bouts, AP grade III-IV at 90deg abduction x3 bouts     Passive ROM  Lt shoulder horizontal abduction: PT blocking scapula, contract/relax stretch              PT Education - 04/13/20 1028    Education Details  technique with therex    Person(s) Educated  Patient    Methods  Explanation;Verbal cues    Comprehension  Verbalized understanding;Returned demonstration       PT Short Term Goals - 03/30/20 0804      PT SHORT TERM GOAL #3   Title  report a 30% reduction in LBP daily tasks    Baseline  70% better    Status  Achieved        PT Long Term Goals - 04/08/20 0844      PT LONG TERM GOAL #1   Title  be indepdent in advanced HEP    Time  8    Period  Weeks    Status  New      PT LONG TERM GOAL #2   Title  reduce FOTO to < or = to 19% limitation    Time  8    Period  Weeks    Status  New      PT LONG TERM GOAL #3   Title  demonstrated Lt shoulder flexion A/ROM to > or to 150 degrees to imrpove overhead reaching    Time  8    Period  Weeks    Status  Achieved      PT LONG TERM GOAL #4   Title  report a 70% reduction in LBP with daily tasks and at the end of the day    Time  8    Status  Achieved      PT LONG TERM GOAL #5   Title  report unlimited use of the Lt UE due to improved functional A/ROM    Time  8    Period  Weeks    Status  New            Plan - 04/13/20 1028    Clinical Impression Statement  Pt was able to maintain Lt shoulder ROM with functional reach to the level of T9 at the start of today's session. PT completed shoulder joint and soft tissue mobilization to increase internal rotation and flexion ROM. Pt had 10 deg improvement in flexion end of session and was able to reach to T8 without pain. Will continue with current POC to  progress Lt shoulder functional ROM and strength.    PT Frequency  2x / week    PT Duration  8 weeks    PT Treatment/Interventions  ADLs/Self Care Home Management;Cryotherapy;Software engineer;Therapeutic activities;Therapeutic exercise;Neuromuscular re-education;Patient/family education;Manual techniques;Taping;Dry needling;Passive range of motion;Vasopneumatic Device;Spinal Manipulations    PT Next Visit Plan  Lt shoulder IR ROM and strengthening; diagonal and HEP; core and gluteal strength    PT Home Exercise Plan  [email or web address]    Consulted and Agree with Plan of Care  Patient       Patient will benefit from skilled therapeutic intervention in order to improve the following deficits and impairments:  Decreased activity tolerance, Decreased strength, Impaired flexibility, Impaired UE functional use, Improper body mechanics, Decreased range of motion, Increased muscle spasms, Decreased mobility  Visit Diagnosis: Chronic left shoulder pain  Stiffness of left shoulder, not elsewhere classified  Chronic right-sided low back pain without sciatica  Cramp and spasm     Problem List Patient Active Problem List   Diagnosis Date Noted  . Fracture, humerus, greater tuberosity 09/11/2019  . Rectal cancer [facility]) s/p lap assisted LAR 09/13/15 09/13/2015    10:34 AM,04/13/20 [name] PT, DPT [place] [facility] [address], [place], [place], [address] Phone: [phone]   Fax:  [phone]  Name: Terri Salazar MRN: [id number] Date of Birth: Aug 31, 1961

## 2020-04-15 ENCOUNTER — Other Ambulatory Visit: Payer: Self-pay

## 2020-04-15 ENCOUNTER — Encounter: Payer: Self-pay | Admitting: Physical Therapy

## 2020-04-15 ENCOUNTER — Ambulatory Visit: Payer: BC Managed Care – PPO | Admitting: Physical Therapy

## 2020-04-15 DIAGNOSIS — M25512 Pain in left shoulder: Secondary | ICD-10-CM | POA: Diagnosis not present

## 2020-04-15 DIAGNOSIS — M25612 Stiffness of left shoulder, not elsewhere classified: Secondary | ICD-10-CM

## 2020-04-15 DIAGNOSIS — G8929 Other chronic pain: Secondary | ICD-10-CM

## 2020-04-15 DIAGNOSIS — R252 Cramp and spasm: Secondary | ICD-10-CM

## 2020-04-15 DIAGNOSIS — M545 Low back pain, unspecified: Secondary | ICD-10-CM

## 2020-04-15 NOTE — Therapy (Signed)
[facility] [address] [place], [place], [address] Phone: [phone]   Fax:  [phone]  Physical Therapy Treatment  Patient Details  Name: Terri Salazar MRN: [id number] Date of Birth: April 14, 1961 Referring Provider (PT): Rande Lawman, MD   Encounter Date: 04/15/2020  PT End of Session - 04/15/20 0927    Visit Number  10    Date for PT Re-Evaluation  04/28/20    Authorization Type  BCBS    Authorization - Visit Number  10    Authorization - Number of Visits  50    PT Start Time  0846    PT Stop Time  0926    PT Time Calculation (min)  40 min    Activity Tolerance  Patient tolerated treatment well;No increased pain    Behavior During Therapy  WFL for tasks assessed/performed       Past Medical History:  Diagnosis Date  . Allergy   . Cancer ([place])   . Right bundle branch block    since 59 years old    Past Surgical History:  Procedure Laterality Date  . ABDOMINAL HYSTERECTOMY    . COLONOSCOPY    . ELBOW SURGERY Right   . LAPAROSCOPIC PARTIAL COLECTOMY N/A 09/13/2015   Procedure: LAPAROSCOPIC LOW ANTERIOR RESECTION OF RECTUM WITH MOBILIZATION OF SPLENIC FLEXURE;  Surgeon: Jackolyn Confer, MD;  Location: [facility];  Service: General;  Laterality: N/A;    There were no vitals filed for this visit.  Subjective Assessment - 04/15/20 0848    Subjective  Pt states that things are going well. Just some soreness from exercises but no pain.    Diagnostic tests  X-ray: lumbar spine DDD.  Lt shoulder fracture-healed    Currently in Pain?  No/denies                        [facility] Adult PT Treatment/Exercise - 04/15/20 0001      Exercises   Exercises  Other Exercises    Other Exercises   thoracic extension over foam roll 2x15 reps       Shoulder Exercises: Sidelying   Other Sidelying Exercises  thoracic rotation x15 reps to the Lt, PT overpressure      Shoulder Exercises: Standing    Other Standing Exercises  half kneel thoracic rotation to the Lt x10 reps    Other Standing Exercises  UE slide reach behind back x10 reps       Manual Therapy   Joint Mobilization  Lt glenohumeral PA mobilization grade III x2 bouts    Soft tissue mobilization  trigger point realease Rt teres and infraspinatus, pt completing passive cross body stretch on the Lt during soft tissue massage             PT Education - 04/15/20 0927    Education Details  technique with therex    Person(s) Educated  Patient    Methods  Explanation;Verbal cues    Comprehension  Verbalized understanding;Returned demonstration       PT Short Term Goals - 03/30/20 0804      PT SHORT TERM GOAL #3   Title  report a 30% reduction in LBP daily tasks    Baseline  70% better    Status  Achieved        PT Long Term Goals - 04/08/20 0844      PT LONG TERM GOAL #1   Title  be indepdent in advanced  HEP    Time  8    Period  Weeks    Status  New      PT LONG TERM GOAL #2   Title  reduce FOTO to < or = to 19% limitation    Time  8    Period  Weeks    Status  New      PT LONG TERM GOAL #3   Title  demonstrated Lt shoulder flexion A/ROM to > or to 150 degrees to imrpove overhead reaching    Time  8    Period  Weeks    Status  Achieved      PT LONG TERM GOAL #4   Title  report a 70% reduction in LBP with daily tasks and at the end of the day    Time  8    Status  Achieved      PT LONG TERM GOAL #5   Title  report unlimited use of the Lt UE due to improved functional A/ROM    Time  8    Period  Weeks    Status  New            Plan - 04/15/20 [id number]    Clinical Impression Statement  Pt continues to notice improvements in reach behind her back following her HEP. She has improved Lt shoulder flexion but lacks 5-10 deg of end range abduction. Pt has limited thoracic rotation and an exercise was added to her HEP to further address this at home. Pt had decreased trigger points in the posterior  shoulder following manual treatment. Will continue with current POC.    PT Frequency  2x / week    PT Duration  8 weeks    PT Treatment/Interventions  ADLs/Self Care Home Management;Cryotherapy;Software engineer;Therapeutic activities;Therapeutic exercise;Neuromuscular re-education;Patient/family education;Manual techniques;Taping;Dry needling;Passive range of motion;Vasopneumatic Device;Spinal Manipulations    PT Next Visit Plan  Lt shoulder IR ROM and strengthening; diagonal and HEP update; core and gluteal strength    PT Home Exercise Plan  [id number]    Consulted and Agree with Plan of Care  Patient       Patient will benefit from skilled therapeutic intervention in order to improve the following deficits and impairments:  Decreased activity tolerance, Decreased strength, Impaired flexibility, Impaired UE functional use, Improper body mechanics, Decreased range of motion, Increased muscle spasms, Decreased mobility  Visit Diagnosis: Chronic left shoulder pain  Stiffness of left shoulder, not elsewhere classified  Chronic right-sided low back pain without sciatica  Cramp and spasm     Problem List Patient Active Problem List   Diagnosis Date Noted  . Fracture, humerus, greater tuberosity 09/11/2019  . Rectal cancer [facility]) s/p lap assisted LAR 09/13/15 09/13/2015    9:52 AM,04/15/20 [name] PT, DPT [place] [facility] [address], [place], [place], [address] Phone: [phone]   Fax:  [phone]  Name: Terri Salazar MRN: [id number] Date of Birth: Aug 21, 1961

## 2020-04-15 NOTE — Patient Instructions (Signed)
Access Code: [email or web address]: [email or web address]: 05/25/2021Prepared by: [facility]  Supine Lower Trunk Rotation - 3 x daily - 7 x weekly - 1 sets - 3 reps - 20 hold  Hooklying Single Knee to Chest - 3 x daily - 7 x weekly - 3 reps - 1 sets - 20 hold  Seated Hamstring Stretch - 3 x daily - 7 x weekly - 3 reps - 1 sets - 20 hold  Seated Figure 4 Piriformis Stretch - 2 x daily - 7 x weekly - 1 sets - 3 reps - 20 hold  Standing Shoulder Internal Rotation Stretch with Towel - 1 x daily - 7 x weekly - 10 reps - 3 sets  Supine Alternating Shoulder Flexion - 1 x daily - 7 x weekly - 10 reps - 3 sets  Standing Shoulder Abduction Slides at Wall - 1 x daily - 7 x weekly - 2 sets - 10 reps - 5 seconds hold  Sidelying Shoulder ER with Towel and Dumbbell - 1 x daily - 7 x weekly - 2 sets - 10 reps  Seated Lifting Hands Behind Back - 1 x daily - 7 x weekly - 3 sets - 10 reps - 3 seconds hold  Sidelying Open Book Thoracic Lumbar Rotation and Extension - 1 x daily - 7 x weekly - 10 reps - 3 sets  [facility] [address], [place], [place] [address] Phone # [phone] Fax [phone]

## 2020-04-20 ENCOUNTER — Other Ambulatory Visit: Payer: Self-pay

## 2020-04-20 ENCOUNTER — Ambulatory Visit: Payer: BC Managed Care – PPO

## 2020-04-20 DIAGNOSIS — M545 Low back pain, unspecified: Secondary | ICD-10-CM

## 2020-04-20 DIAGNOSIS — G8929 Other chronic pain: Secondary | ICD-10-CM

## 2020-04-20 DIAGNOSIS — M25512 Pain in left shoulder: Secondary | ICD-10-CM

## 2020-04-20 DIAGNOSIS — R252 Cramp and spasm: Secondary | ICD-10-CM

## 2020-04-20 DIAGNOSIS — M25612 Stiffness of left shoulder, not elsewhere classified: Secondary | ICD-10-CM

## 2020-04-20 NOTE — Therapy (Signed)
[facility] [address] [place], [place], [address] Phone: [phone]   Fax:  [phone]  Physical Therapy Treatment  Patient Details  Name: Terri Salazar MRN: [id number] Date of Birth: 1961/02/25 Referring Provider (PT): Rande Lawman, MD   Encounter Date: 04/20/2020  PT End of Session - 04/20/20 0925    Visit Number  11    Date for PT Re-Evaluation  04/28/20    Authorization Type  BCBS    Authorization - Visit Number  11    Authorization - Number of Visits  50    PT Start Time  2707    PT Stop Time  0923    PT Time Calculation (min)  36 min    Activity Tolerance  Patient tolerated treatment well;No increased pain    Behavior During Therapy  WFL for tasks assessed/performed       Past Medical History:  Diagnosis Date  . Allergy   . Cancer ([place])   . Right bundle branch block    since 59 years old    Past Surgical History:  Procedure Laterality Date  . ABDOMINAL HYSTERECTOMY    . COLONOSCOPY    . ELBOW SURGERY Right   . LAPAROSCOPIC PARTIAL COLECTOMY N/A 09/13/2015   Procedure: LAPAROSCOPIC LOW ANTERIOR RESECTION OF RECTUM WITH MOBILIZATION OF SPLENIC FLEXURE;  Surgeon: Jackolyn Confer, MD;  [facility]: [facility];  Service: General;  Laterality: N/A;    There were no vitals filed for this visit.  Subjective Assessment - 04/20/20 0845    Subjective  I have been a little move sore from doing stretching but not incresaed pain.  I feel 95% better overall.    Currently in Pain?  No/denies                        [facility] Adult PT Treatment/Exercise - 04/20/20 0001      Lumbar Exercises: Aerobic   UBE (Upper Arm Bike)  Level 2x6 minutes (3/3)      Shoulder Exercises: Supine   Horizontal ABduction  Strengthening;Both;20 reps;Theraband    Theraband Level (Shoulder Horizontal ABduction)  Level 3 (Green)    External Rotation  Strengthening;20 reps;Theraband    Theraband Level (Shoulder  External Rotation)  Level 3 (Green)    External Rotation Limitations  on foam roll    Diagonals  Strengthening;20 reps    Diagonals Limitations  on foam roll      Shoulder Exercises: Sidelying   Other Sidelying Exercises  thoracic rotation x15 reps to the Lt, PT overpressure               PT Short Term Goals - 03/30/20 0804      PT SHORT TERM GOAL #3   Title  report a 30% reduction in LBP daily tasks    Baseline  70% better    Status  Achieved        PT Long Term Goals - 04/20/20 0858      PT LONG TERM GOAL #4   Title  report a 70% reduction in LBP with daily tasks and at the end of the day    Baseline  95% improvement    Status  Achieved      PT LONG TERM GOAL #5   Title  report unlimited use of the Lt UE due to improved functional A/ROM            Plan - 04/20/20 8675  Clinical Impression Statement  Pt reports 95% improvement in shoulder and thoracic symptoms since the start of care.  Pt denies any pain unless reaching behind the back with the Lt UE.  Pt with improved Lt shoulder flexibility overall and remains limited with IR behind the back.  Pt demonstrated core instability with exercise supine on the foam roll and required verbal cues.  Pt will continue to benefit from skilled PT to improve Lt shoulder flexibility, strength and postural strength.    PT Frequency  2x / week    PT Duration  8 weeks    PT Treatment/Interventions  ADLs/Self Care Home Management;Cryotherapy;Software engineer;Therapeutic activities;Therapeutic exercise;Neuromuscular re-education;Patient/family education;Manual techniques;Taping;Dry needling;Passive range of motion;Vasopneumatic Device;Spinal Manipulations    PT Next Visit Plan  finalize HEP and D/C.  FOTO    PT Home Exercise Plan  [facility]    Recommended Other Services  initial cert is signed    Consulted and Agree with Plan of Care  Patient       Patient will benefit from skilled therapeutic  intervention in order to improve the following deficits and impairments:  Decreased activity tolerance, Decreased strength, Impaired flexibility, Impaired UE functional use, Improper body mechanics, Decreased range of motion, Increased muscle spasms, Decreased mobility  Visit Diagnosis: Chronic left shoulder pain  Stiffness of left shoulder, not elsewhere classified  Chronic right-sided low back pain without sciatica  Cramp and spasm     Problem List Patient Active Problem List   Diagnosis Date Noted  . Fracture, humerus, greater tuberosity 09/11/2019  . Rectal cancer [facility]) s/p lap assisted LAR 09/13/15 09/13/2015     Sigurd Sos, PT 04/20/20 9:30 AM  [facility] [address] [place], [place], [address] Phone: [phone]   Fax:  [phone]  Name: Terri Salazar MRN: [id number] Date of Birth: 11-13-1961

## 2020-04-22 ENCOUNTER — Ambulatory Visit: Payer: BC Managed Care – PPO

## 2020-04-22 ENCOUNTER — Other Ambulatory Visit: Payer: Self-pay

## 2020-04-22 DIAGNOSIS — R252 Cramp and spasm: Secondary | ICD-10-CM

## 2020-04-22 DIAGNOSIS — G8929 Other chronic pain: Secondary | ICD-10-CM

## 2020-04-22 DIAGNOSIS — M25612 Stiffness of left shoulder, not elsewhere classified: Secondary | ICD-10-CM

## 2020-04-22 DIAGNOSIS — M25512 Pain in left shoulder: Secondary | ICD-10-CM

## 2020-04-22 DIAGNOSIS — M545 Low back pain, unspecified: Secondary | ICD-10-CM

## 2020-04-22 NOTE — Patient Instructions (Addendum)
Access Code: [email or web address] URL: [email or web address] Date: 04/22/2020 Prepared by: Laqueta Linden - 2 x daily - 7 x weekly - 2 sets - 10 reps Supine Shoulder Horizontal Abduction with Resistance - 2 x daily - 7 x weekly - 10 reps - 2 sets Supine Bilateral Shoulder External Rotation with Resistance - 2 x daily - 7 x weekly - 10 reps - 2 sets Supine PNF D2 Flexion with Resistance - 2 x daily - 7 x weekly - 10 reps - 2 sets

## 2020-04-22 NOTE — Therapy (Signed)
[facility] [address] [place], [place], [address] Phone: [phone]   Fax:  [phone]  Physical Therapy Treatment  Patient Details  Name: Terri Salazar MRN: [id number] Date of Birth: 03/25/1961 Referring Provider (PT): Rande Lawman, MD   Encounter Date: 04/22/2020   PT End of Session - 04/22/20 0921    Visit Number 12    Date for PT Re-Evaluation 04/28/20    Authorization Type BCBS    Authorization - Visit Number 12    Authorization - Number of Visits 50    PT Start Time 0845    PT Stop Time 0919    PT Time Calculation (min) 34 min    Activity Tolerance Patient tolerated treatment well;No increased pain    Behavior During Therapy WFL for tasks assessed/performed           Past Medical History:  Diagnosis Date  . Allergy   . Cancer ([place])   . Right bundle branch block    since 59 years old    Past Surgical History:  Procedure Laterality Date  . ABDOMINAL HYSTERECTOMY    . COLONOSCOPY    . ELBOW SURGERY Right   . LAPAROSCOPIC PARTIAL COLECTOMY N/A 09/13/2015   Procedure: LAPAROSCOPIC LOW ANTERIOR RESECTION OF RECTUM WITH MOBILIZATION OF SPLENIC FLEXURE;  Surgeon: Jackolyn Confer, MD;  [facility]: [facility];  Service: General;  Laterality: N/A;    There were no vitals filed for this visit.       [facility] PT Assessment - 04/22/20 0001      Assessment   Medical Diagnosis midline low back pain, Lt shoulder pain    Referring Provider (PT) Rande Lawman, MD    Onset Date/Surgical Date 05/17/19      Observation/Other Assessments   Focus on Therapeutic Outcomes (FOTO)  23% limitation      AROM   Overall AROM Comments Full Lt shoulder A/ROM except IR limited by 1.5 inch vs the Rt.      Strength   Overall Strength Comments 5/5 MMT except Lt external rotation 4/5 MMT- no pain with testing                         [facility] Adult PT Treatment/Exercise - 04/22/20 0001      Lumbar  Exercises: Aerobic   UBE (Upper Arm Bike) Level 2x6 minutes (3/3)      Lumbar Exercises: Sidelying   Clam Both;20 reps      Shoulder Exercises: Supine   Horizontal ABduction Strengthening;Both;20 reps;Theraband    Theraband Level (Shoulder Horizontal ABduction) Level 3 (Green)    External Rotation Strengthening;20 reps;Theraband    Theraband Level (Shoulder External Rotation) Level 3 (Green)    External Rotation Limitations on foam roll    Diagonals Strengthening;20 reps    Diagonals Limitations on foam roll                  PT Education - 04/22/20 0903    Education Details Access Code: [email or web address]    Person(s) Educated Patient    Methods Explanation;Demonstration;Handout    Comprehension Verbalized understanding;Returned demonstration            PT Short Term Goals - 03/30/20 0804      PT SHORT TERM GOAL #3   Title report a 30% reduction in LBP daily tasks    Baseline 70% better    Status Achieved  PT Long Term Goals - 04/22/20 0904      PT LONG TERM GOAL #1   Title be indepdent in advanced HEP    Status Achieved      PT LONG TERM GOAL #2   Title reduce FOTO to < or = to 19% limitation    Baseline 23% limitation    Status Partially Met      PT LONG TERM GOAL #3   Title demonstrated Lt shoulder flexion A/ROM to > or to 150 degrees to imrpove overhead reaching    Status Achieved      PT LONG TERM GOAL #4   Title report a 70% reduction in LBP with daily tasks and at the end of the day    Baseline 95% improvement      PT LONG TERM GOAL #5   Title report unlimited use of the Lt UE due to improved functional A/ROM    Status Achieved                 Plan - 04/22/20 0911    Clinical Impression Statement Pt is ready for D/C to HEP.  Pt reports 95% improvement in shoulder and lumbar symptoms since the start of care.  Pt denies any pain or limitation in the use of the Lt UE unless reaching behind the back with the Lt UE. IR has improved  and is lacking 1.5 inches vs the Rt.  Lt shoulder ER is 4/5 and pt will continue to work on this for exercise at home.  Pt with mild LT LE pain at the end of the day.  FOTO is improved to 23% limitation.  Pt with continue with HEP for core and hip flexibility/strength and shoulder flexibility and strength.    PT Next Visit Plan D/C PT to HEP    PT Home Exercise Plan [facility]    Consulted and Agree with Plan of Care Patient           Patient will benefit from skilled therapeutic intervention in order to improve the following deficits and impairments:     Visit Diagnosis: Chronic left shoulder pain  Stiffness of left shoulder, not elsewhere classified  Chronic right-sided low back pain without sciatica  Cramp and spasm     Problem List Patient Active Problem List   Diagnosis Date Noted  . Fracture, humerus, greater tuberosity 09/11/2019  . Rectal cancer [facility]) s/p lap assisted LAR 09/13/15 09/13/2015    PHYSICAL THERAPY DISCHARGE SUMMARY  Visits from Start of Care: 12  Current functional level related to goals / functional outcomes: See above for current status.     Remaining deficits: Some Lt LE pain at the end of the day and limited Lt UE A/ROM into IR.  Pt has HEP in place to address remaining deficits.     Education / Equipment: HEP Plan: Patient agrees to discharge.  Patient goals were partially met. Patient is being discharged due to being pleased with the current functional level.  ?????        Sigurd Sos, PT 04/22/20 9:27 AM   [facility] [address], [place], [place], [address] Phone: [phone]   Fax:  [phone]  Name: Terri Salazar MRN: [id number] Date of Birth: June 03, 1961

## 2020-08-11 ENCOUNTER — Ambulatory Visit (INDEPENDENT_AMBULATORY_CARE_PROVIDER_SITE_OTHER): Payer: BC Managed Care – PPO | Admitting: *Deleted

## 2020-08-11 ENCOUNTER — Other Ambulatory Visit: Payer: Self-pay

## 2020-08-11 DIAGNOSIS — Z23 Encounter for immunization: Secondary | ICD-10-CM

## 2020-08-11 NOTE — Progress Notes (Signed)
Per orders of Dr. Jerilee Hoh, injection of influenza  given by Westley Hummer. Patient tolerated injection well.

## 2021-01-21 LAB — HM PAP SMEAR

## 2021-02-16 ENCOUNTER — Encounter: Payer: Self-pay | Admitting: Internal Medicine

## 2021-02-16 LAB — BASIC METABOLIC PANEL: Creatinine: 1.2 — AB (ref <=1.1)

## 2021-02-17 ENCOUNTER — Telehealth: Payer: Self-pay | Admitting: Internal Medicine

## 2021-02-17 ENCOUNTER — Other Ambulatory Visit: Payer: Self-pay

## 2021-02-17 NOTE — Telephone Encounter (Signed)
Spoke with patient and a follow up appointment scheduled

## 2021-02-17 NOTE — Telephone Encounter (Signed)
The Patient had a MyChart message about her lab results and it showed that her Creatine levels are up and she is not for sure that these are her labs because she had labs done at Dr. Lorie Apley office and didn't know that she did the whole Basic Metabolic panel.  Please advise

## 2021-02-18 ENCOUNTER — Ambulatory Visit (INDEPENDENT_AMBULATORY_CARE_PROVIDER_SITE_OTHER): Payer: BC Managed Care – PPO | Admitting: Internal Medicine

## 2021-02-18 ENCOUNTER — Encounter: Payer: Self-pay | Admitting: Internal Medicine

## 2021-02-18 VITALS — BP 110/80 | HR 65 | Temp 98.0°F | Wt 136.0 lb

## 2021-02-18 DIAGNOSIS — R899 Unspecified abnormal finding in specimens from other organs, systems and tissues: Secondary | ICD-10-CM | POA: Diagnosis not present

## 2021-02-18 LAB — BASIC METABOLIC PANEL
BUN: 15 mg/dL (ref 6–23)
CO2: 30 mEq/L (ref 19–32)
Calcium: 9.6 mg/dL (ref 8.4–10.5)
Chloride: 102 mEq/L (ref 96–112)
Creatinine, Ser: 0.65 mg/dL (ref 0.40–1.20)
GFR: 96.4 mL/min (ref >60.00)
Glucose, Bld: 88 mg/dL (ref 70–99)
Potassium: 4.5 mEq/L (ref 3.5–5.1)
Sodium: 139 mEq/L (ref 135–145)

## 2021-02-18 NOTE — Progress Notes (Signed)
Established Patient Office Visit     This visit occurred during the SARS-CoV-2 public health emergency.  Safety protocols were in place, including screening questions prior to the visit, additional usage of staff PPE, and extensive cleaning of exam room while observing appropriate contact time as indicated for disinfecting solutions.    CC/Reason for Visit: Abnormal lab  HPI: Terri Salazar is a 60 y.o. female who is coming in today for the above mentioned reasons.  A creatinine of 1.2 was abstracted into her chart.  We think the lab might have come from Dr. Lorie Apley office.  She is concerned because her creatinine has always been in the  0.6-0.8 range.  This is the only reason for her visit today.  Past Medical/Surgical History: Past Medical History:  Diagnosis Date  . Allergy   . Cancer ([place])   . Right bundle branch block    since 60 years old    Past Surgical History:  Procedure Laterality Date  . ABDOMINAL HYSTERECTOMY    . COLONOSCOPY    . ELBOW SURGERY Right   . LAPAROSCOPIC PARTIAL COLECTOMY N/A 09/13/2015   Procedure: LAPAROSCOPIC LOW ANTERIOR RESECTION OF RECTUM WITH MOBILIZATION OF SPLENIC FLEXURE;  Surgeon: Jackolyn Confer, MD;  Location: [facility];  Service: General;  Laterality: N/A;    Social History:  reports that she has never smoked. She has never used smokeless tobacco. She reports current alcohol use. She reports that she does not use drugs.  Allergies: No Known Allergies  Family History:  No history of heart disease, cancer, stroke that she is aware of  Current Outpatient Medications:  .  calcium carbonate (OSCAL) 1500 (600 Ca) MG TABS tablet, Take by mouth 2 (two) times daily with a meal., Disp: , Rfl:  .  diphenhydrAMINE (BENADRYL) 25 mg capsule, Take 25 mg by mouth at bedtime as needed for sleep. , Disp: , Rfl:  .  Multiple Vitamins-Minerals (MULTI FOR HER PO), Take 1 tablet by mouth daily. , Disp: , Rfl:  .  Polyethylene Glycol 3350 (MIRALAX PO),  Take 17 g by mouth daily as needed (constipation). , Disp: , Rfl:  .  pseudoephedrine (SUDAFED) 30 MG tablet, Take 30 mg by mouth every 4 (four) hours as needed for congestion., Disp: , Rfl:   Review of Systems:  Constitutional: Denies fever, chills, diaphoresis, appetite change and fatigue.  HEENT: Denies photophobia, eye pain, redness, hearing loss, ear pain, congestion, sore throat, rhinorrhea, sneezing, mouth sores, trouble swallowing, neck pain, neck stiffness and tinnitus.   Respiratory: Denies SOB, DOE, cough, chest tightness,  and wheezing.   Cardiovascular: Denies chest pain, palpitations and leg swelling.  Gastrointestinal: Denies nausea, vomiting, abdominal pain, diarrhea, constipation, blood in stool and abdominal distention.  Genitourinary: Denies dysuria, urgency, frequency, hematuria, flank pain and difficulty urinating.  Endocrine: Denies: hot or cold intolerance, sweats, changes in hair or nails, polyuria, polydipsia. Musculoskeletal: Denies myalgias, back pain, joint swelling, arthralgias and gait problem.  Skin: Denies pallor, rash and wound.  Neurological: Denies dizziness, seizures, syncope, weakness, light-headedness, numbness and headaches.  Hematological: Denies adenopathy. Easy bruising, personal or family bleeding history  Psychiatric/Behavioral: Denies suicidal ideation, mood changes, confusion, nervousness, sleep disturbance and agitation    Physical Exam: Vitals:   02/18/21 0720  BP: 110/80  Pulse: 65  Temp: 98 F (36.7 C)  TempSrc: Oral  SpO2: 96%  Weight: 136 lb (61.7 kg)    Body mass index is 20.68 kg/m.   Constitutional: NAD, calm, comfortable  Eyes: PERRL, lids and conjunctivae normal, wears corrective lenses ENMT: Mucous membranes are moist. Respiratory: clear to auscultation bilaterally, no wheezing, no crackles. Normal respiratory effort. No accessory muscle use.  Cardiovascular: Regular rate and rhythm, no murmurs / rubs / gallops. No  extremity edema.  Neurologic: Grossly intact and nonfocal Psychiatric: Normal judgment and insight. Alert and oriented x 3. Normal mood.    Impression and Plan:  Abnormal laboratory test -A creatinine of 1.2 was abstracted, she would like this rechecked today, follow-up with basic metabolic profile.  Further recommendations to follow results.  - Plan: Basic metabolic panel     Emersyn Wyss Isaac Bliss, MD [facility]

## 2021-11-24 LAB — HM DEXA SCAN

## 2022-01-31 LAB — LIPID PANEL
Cholesterol: 290 — AB (ref 0–200)
HDL: 99 — AB (ref 35–70)
LDL Cholesterol: 168
Triglycerides: 132 (ref 40–160)

## 2022-01-31 LAB — HM DEXA SCAN

## 2022-01-31 LAB — HM MAMMOGRAPHY

## 2022-01-31 LAB — HEMOGLOBIN A1C: Hemoglobin A1C: 5.4

## 2022-01-31 LAB — BASIC METABOLIC PANEL: Glucose: 108

## 2022-02-07 ENCOUNTER — Encounter: Payer: Self-pay | Admitting: Internal Medicine

## 2022-05-06 ENCOUNTER — Encounter: Payer: Self-pay | Admitting: Internal Medicine

## 2022-05-10 ENCOUNTER — Encounter: Payer: Self-pay | Admitting: Internal Medicine

## 2023-02-06 LAB — LAB REPORT - SCANNED: A1c: 5.2

## 2023-02-22 NOTE — Progress Notes (Unsigned)
ACUTE VISIT PCP: Dr Ardyth Harps.  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 Denies numbness or tingling.    HPI: [name] is a 62 y.o. female, who is here today complaining of right shoulder pain as describes as described above. Right shoulder pain that began in January/2024 after putting away christmas decorations, although she does not recall a specific injury.   Shoulder Pain  The pain is present in the right shoulder. This is a new problem. The current episode started more than 1 month ago. There has been no history of extremity trauma. The problem occurs intermittently. The problem has been unchanged. The pain is at a severity of 4/10. Pertinent negatives include no fever, inability to bear weight, itching, joint locking, joint swelling, limited range of motion, numbness, stiffness or tingling. Family history does not include gout or rheumatoid arthritis. There is no history of diabetes, gout, osteoarthritis or rheumatoid arthritis.   The pain is characterized as sharp, situated within the joint, and is aggravated by specific movements. On a pain scale of 1 to 10, the patient rates the pain as a 3 or 4, with no radiation to the arm or neck.  There are no symptoms of cough, wheezing, or chest pain reported.  She has a medical history of stage 1 colon cancer, which was addressed surgically in October 2016. She is requesting CEA level check for routine surveillance, states that it was recommended to be done annually.  Negative for abdominal pain, nausea, vomiting, changes in bowel habits, blood in stool or melena.  She follows with gastroenterologist, Dr. Loreta Ave. Last colonoscopy 08/2019.  Review of Systems  Constitutional:  Negative for activity change, appetite change, fever and unexpected weight change.  HENT:  Negative for sore throat and trouble swallowing.    Respiratory:  Negative for cough, shortness of breath and wheezing.   Musculoskeletal:  Positive for arthralgias. Negative for gout, joint swelling and stiffness.  Skin:  Negative for itching and rash.  Neurological:  Negative for tingling, syncope and numbness.  See other pertinent positives and negatives in HPI.  Current Outpatient Medications on File Prior to Visit  Medication Sig Dispense Refill   calcium carbonate (OSCAL) 1500 (600 Ca) MG TABS tablet Take by mouth 2 (two) times daily with a meal.     diphenhydrAMINE (BENADRYL) 25 mg capsule Take 25 mg by mouth at bedtime as needed for sleep.      Multiple Vitamins-Minerals (MULTI FOR HER PO) Take 1 tablet by mouth daily.      Polyethylene Glycol 3350 (MIRALAX PO) Take 17 g by mouth daily as needed (constipation).      pseudoephedrine (SUDAFED) 30 MG tablet Take 30 mg by mouth every 4 (four) hours as needed for congestion.     No current facility-administered medications on file prior to visit.    Past Medical History:  Diagnosis Date   Allergy    Cancer    Right bundle branch block    since 62 years old   No Known Allergies  Social History   Socioeconomic History   Marital status: Married    Spouse name: Not on file   Number of children: Not on file   Years of education: Not on file   Highest education level: Master's degree (e.g., MA, MS, MEng, MEd, MSW, MBA)  Occupational History   Not on file  Tobacco Use  Smoking status: Never   Smokeless tobacco: Never  Substance and Sexual Activity   Alcohol use: Yes    Alcohol/week: 0.0 standard drinks of alcohol    Comment: occasional/social   Drug use: No   Sexual activity: Not on file  Other Topics Concern   Not on file  Social History Narrative   Not on file   Social Determinants of Health   Financial Resource Strain: Low Risk  (02/21/2023)   Overall Financial Resource Strain (CARDIA)    Difficulty of Paying Living Expenses: Not hard at all  Food Insecurity: No  Food Insecurity (02/21/2023)   Hunger Vital Sign    Worried About Running Out of Food in the Last Year: Never true    Ran Out of Food in the Last Year: Never true  Transportation Needs: No Transportation Needs (02/21/2023)   PRAPARE - Administrator, Civil Service (Medical): No    Lack of Transportation (Non-Medical): No  Physical Activity: Sufficiently Active (02/21/2023)   Exercise Vital Sign    Days of Exercise per Week: 5 days    Minutes of Exercise per Session: 30 min  Stress: No Stress Concern Present (02/21/2023)   [employer] of Occupational Health - Occupational Stress Questionnaire    Feeling of Stress : Only a little  Social Connections: Moderately Isolated (02/21/2023)   Social Connection and Isolation Panel [NHANES]    Frequency of Communication with Friends and Family: More than three times a week    Frequency of Social Gatherings with Friends and Family: Once a week    Attends Religious Services: Never    Database administrator or Organizations: No    Attends Banker Meetings: Not on file    Marital Status: Married   Vitals:   02/23/23 0953  BP: 126/80  Pulse: 64  Resp: 16  Temp: 98 F (36.7 C)  SpO2: 97%   Body mass index is 21 kg/m.  Physical Exam Vitals and nursing note reviewed.  Constitutional:      General: She is not in acute distress.    Appearance: She is well-developed. She is not ill-appearing.  HENT:     Head: Normocephalic and atraumatic.  Eyes:     Conjunctiva/sclera: Conjunctivae normal.  Cardiovascular:     Rate and Rhythm: Normal rate and regular rhythm.     Heart sounds: No murmur heard. Pulmonary:     Effort: Pulmonary effort is normal. No respiratory distress.     Breath sounds: Normal breath sounds.  Musculoskeletal:     Right shoulder: Tenderness present. No deformity, effusion or bony tenderness. Normal range of motion.     Comments: Right shoulder: No deformity, edema, or erythema appreciated.No  muscle atrophy. Juanetta Gosling' test elicits mild pain, empty can supraspinatus test neg, lift-Off Subscapularis test pos. ROM with no significant limitation.. Pain mainly with exter rotation.  Lymphadenopathy:     Cervical: No cervical adenopathy.  Skin:    General: Skin is warm.     Findings: No erythema or rash.  Neurological:     Mental Status: She is alert and oriented to person, place, and time.   ASSESSMENT AND PLAN:  Ms. Marett was seen today for right shoulder pain.  Right shoulder pain, unspecified chronicity We discussed possible etiologies. ? Rotator cuff pathology. I do not think imaging is needed at this time. PT recommended. Meloxicam 7.5 mg daily x 7-10 days then prn. Some side effects discussed. F/U as needed.  -  Ambulatory referral to Physical Therapy -     Meloxicam; Take 1 tablet (7.5 mg total) by mouth daily.  Dispense: 30 tablet; Refill: 0  Patient request for diagnostic testing -     CEA  Hx of colon cancer, stage I Rectal cancer S/P laparoscopic resection of rectum on 09/13/2015. Follows with GI.  -     CEA  Return if symptoms worsen or fail to improve.  Danne Scardina G. [place], MD  [facility]. [place] office.

## 2023-02-23 ENCOUNTER — Encounter: Payer: Self-pay | Admitting: Family Medicine

## 2023-02-23 ENCOUNTER — Ambulatory Visit (INDEPENDENT_AMBULATORY_CARE_PROVIDER_SITE_OTHER): Payer: BC Managed Care – PPO | Admitting: Family Medicine

## 2023-02-23 VITALS — BP 126/80 | HR 64 | Temp 98.0°F | Resp 16 | Ht 68.0 in | Wt 138.1 lb

## 2023-02-23 DIAGNOSIS — M25511 Pain in right shoulder: Secondary | ICD-10-CM

## 2023-02-23 DIAGNOSIS — Z85038 Personal history of other malignant neoplasm of large intestine: Secondary | ICD-10-CM

## 2023-02-23 DIAGNOSIS — Z0189 Encounter for other specified special examinations: Secondary | ICD-10-CM | POA: Diagnosis not present

## 2023-02-23 MED ORDER — MELOXICAM 7.5 MG PO TABS: 7.5000 mg | ORAL_TABLET | Freq: Every day | ORAL | 0 refills | Status: DC

## 2023-02-23 NOTE — Patient Instructions (Addendum)
A few things to remember from today's visit:  Patient request for diagnostic testing - Plan: CEA  Hx of colon cancer, stage I - Plan: CEA  Right shoulder pain, unspecified chronicity - Plan: Ambulatory referral to Physical Therapy, meloxicam (MOBIC) 7.5 MG tablet  Meloxicam daily for 7-10 days then as needed for pain. Physical therapy will be arranged.  If you need refills for medications you take chronically, please call your pharmacy. Do not use My Chart to request refills or for acute issues that need immediate attention. If you send a my chart message, it may take a few days to be addressed, specially if I am not in the office.  Please be sure medication list is accurate. If a new problem present, please set up appointment sooner than planned today.

## 2023-02-26 LAB — CEA: CEA: <2 ng/mL

## 2023-03-12 NOTE — Therapy (Signed)
OUTPATIENT PHYSICAL THERAPY SHOULDER EVALUATION   Patient Name: Terri Salazar MRN: [id number] DOB:07/24/1961, 62 y.o., female Today's Date: 03/13/2023  END OF SESSION:  PT End of Session - 03/13/23 0930     Visit Number 1    Date for PT Re-Evaluation 05/11/23    Authorization Type BCBS    PT Start Time 0803    PT Stop Time 0840    PT Time Calculation (min) 37 min    Activity Tolerance Patient tolerated treatment well    Behavior During Therapy WFL for tasks assessed/performed             Past Medical History:  Diagnosis Date   Allergy    Cancer ([facility])    Right bundle branch block    since 61 years old   Past Surgical History:  Procedure Laterality Date   ABDOMINAL HYSTERECTOMY     COLONOSCOPY     ELBOW SURGERY Right    LAPAROSCOPIC PARTIAL COLECTOMY N/A 09/13/2015   Procedure: LAPAROSCOPIC LOW ANTERIOR RESECTION OF RECTUM WITH MOBILIZATION OF SPLENIC FLEXURE;  Surgeon: Avel Peace, MD;  Location: [facility];  Service: General;  Laterality: N/A;   Patient Active Problem List   Diagnosis Date Noted   Fracture, humerus, greater tuberosity 09/11/2019   Rectal cancer [facility]) s/p lap assisted LAR 09/13/15 09/13/2015    PCP: Chaya Jan, MD  REFERRING PROVIDER: [place], Betty, MD  REFERRING DIAG: M25.511 (ICD-10-CM) - Right shoulder pain, unspecified chronicity   THERAPY DIAG:  Chronic right shoulder pain - Plan: PT plan of care cert/re-cert  Abnormal posture - Plan: PT plan of care cert/re-cert  Rationale for Evaluation and Treatment: Rehabilitation  ONSET DATE: 4 months ago   SUBJECTIVE:                                                                                                                                                                                      SUBJECTIVE STATEMENT: Pt is a 62 y.o. female, who is here today complaining of right shoulder that began in January 2024 after putting away [employer], although she does not  recall a specific injury. Pt reports 1-5/10 Rt shoulder pain with "certain movement"    PERTINENT HISTORY: Lt humerus fracture, rectal cancer   PAIN:  Are you having pain? Yes: NPRS scale: 1/10 Pain location: Rt shoulder to deltoid Pain description: intermittent, shooting  Aggravating factors: taking shirt on and off, reaching across body, cleaning sink Relieving factors: stopping the aggravating activity, pain meds  PRECAUTIONS: Other: history of cancer   WEIGHT BEARING RESTRICTIONS: No  FALLS:  Has patient fallen in last 6 months? No  LIVING ENVIRONMENT: Lives with:  lives with their spouse Lives in: House/apartment  OCCUPATION: Does not work   PLOF: Independent, Scientific laboratory technician, Leisure: 2 miles of walking daily, gardening.   PATIENT GOALS:reduce Rt shoulder pain  NEXT MD VISIT:   OBJECTIVE:   DIAGNOSTIC FINDINGS:  None recent   PATIENT SURVEYS:  03/13/23: FOTO 64, goal is 61  COGNITION: Overall cognitive status: Within functional limits for tasks assessed     SENSATION: WFL  POSTURE: Mild rounded shoulder posture, scapular elevation with Rt UE flexion and abduction   UPPER EXTREMITY MMT:  Rt=Lt and 4+/5 to 5/5 throughout.  Pain with resisted flexion and ER  UPPER EXTREMITY ROM:  ROM is all WFLs with Rt=Lt.  Pt with scapular elevation with flexion, abduction and horizontal adduction on the Rt  SHOULDER SPECIAL TESTS: Impingement tests: Neer impingement test: positive   JOINT MOBILITY TESTING:  normal  PALPATION:  Palpable tenderness over Rt anterior glenohumeral joint and external rotators.  Normal joint mobility     TODAY'S TREATMENT:                                                                                                                                         DATE: 03/13/23 HEP established-see below    PATIENT EDUCATION: Education details: [email or web address] Person educated: Patient Education method: Explanation, Demonstration, and  Handouts Education comprehension: verbalized understanding and returned demonstration  HOME EXERCISE PROGRAM: Access Code: [email or web address] URL: [email or web address] Date: 03/13/2023 Prepared by: Tresa Endo  Exercises - Shoulder Flexion Wall Slide with Towel  - 2 x daily - 7 x weekly - 1 sets - 10 reps - 5 hold - Standing Shoulder Abduction Slides at Wall  - 2 x daily - 7 x weekly - 1 sets - 10 reps - 5 hold - Standing Shoulder External Rotation with Resistance  - 2 x daily - 7 x weekly - 2 sets - 10 reps - Seated Correct Posture  - 1 x daily - 7 x weekly - 3 sets - 10 reps - Seated Shoulder Flexion  - 2 x daily - 7 x weekly - 2 sets - 10 reps - Supine Shoulder Horizontal Abduction and Adduction  - 2 x daily - 7 x weekly - 2 sets - 10 reps  ASSESSMENT:  CLINICAL IMPRESSION: Patient is a 62 y.o. female who was seen today for physical therapy evaluation and treatment for Rt shoulder pain that began 4 months ago. Pt reports 1-5/10 Rt shoulder pain that is present with certain movements of the arm including taking on/off a shirt, cleaning her sink and reaching across her body.  Pain is relieved when the aggravation motion is stopped.  Pt with painful arc on the Rt at 95-120 degrees of shoulder flexion.  Pt with anterior shoulder pain with horizontal adduction and abduction motions.  Scapular compensatory elevation with shoulder flexion and abduction.  Palpable tenderness over anterior and posterior glenohumeral joint.  Patient will  benefit from skilled PT to address the below impairments and improve overall function.   OBJECTIVE IMPAIRMENTS: decreased activity tolerance, decreased ROM, decreased strength, impaired flexibility, impaired UE functional use, postural dysfunction, and pain.   ACTIVITY LIMITATIONS: carrying, lifting, dressing, reach over head, and hygiene/grooming  PARTICIPATION LIMITATIONS: meal prep and cleaning  PERSONAL FACTORS: Time since onset of injury/illness/exacerbation  are also affecting patient's functional outcome.   REHAB POTENTIAL: Good  CLINICAL DECISION MAKING: Stable/uncomplicated  EVALUATION COMPLEXITY: Low   GOALS: Goals reviewed with patient? Yes  SHORT TERM GOALS: Target date: 04/10/2023    Be independent in initial HEP Baseline: Goal status: INITIAL  2.  Report > or = to 30% reduction in Rt UE pain with functional use  Baseline:  Goal status: INITIAL  3.  Verbalize and demonstrate postural corrections to scapular depression and retraction with use of Rt UE Baseline:  Goal status: INITIAL   LONG TERM GOALS: Target date: 05/11/23  Be independent in advanced HEP Baseline:  Goal status: INITIAL  2.  Improve FOTO to > or = to 70 Baseline: 64 Goal status: INITIAL  3.  Report > or = to 70% reduction in Rt UE pain with functional use Baseline:  Goal status: INITIAL  4.  Reach overhead and across body for self-care without scapular elevation Baseline:  Goal status: INITIAL  5.  Demonstrate Rt UE flexion without painful arc or scapular elevation Baseline:  Goal status: INITIAL  PLAN:  PT FREQUENCY: 1-2x/week  PT DURATION: 8 weeks  PLANNED INTERVENTIONS: Therapeutic exercises, Therapeutic activity, Neuromuscular re-education, Balance training, Gait training, Patient/Family education, Self Care, Joint mobilization, Aquatic Therapy, Dry Needling, Electrical stimulation, Cryotherapy, Moist heat, Taping, Ionotophoresis 4mg /ml Dexamethasone, Manual therapy, and Re-evaluation  PLAN FOR NEXT SESSION: Scapular strength, shoulder ROM with emphasis on mechanics, thoracic mobility    Lorrene Reid, PT 03/13/23 9:32 AM   [facility] [address], [address] [place], [place] [address] Phone # [phone] Fax [phone]

## 2023-03-13 ENCOUNTER — Ambulatory Visit: Payer: BC Managed Care – PPO | Attending: Family Medicine

## 2023-03-13 ENCOUNTER — Other Ambulatory Visit: Payer: Self-pay

## 2023-03-13 DIAGNOSIS — R293 Abnormal posture: Secondary | ICD-10-CM

## 2023-03-13 DIAGNOSIS — G8929 Other chronic pain: Secondary | ICD-10-CM

## 2023-03-13 DIAGNOSIS — M25511 Pain in right shoulder: Secondary | ICD-10-CM | POA: Diagnosis present

## 2023-03-22 ENCOUNTER — Other Ambulatory Visit: Payer: Self-pay | Admitting: Family Medicine

## 2023-03-22 DIAGNOSIS — M25511 Pain in right shoulder: Secondary | ICD-10-CM

## 2023-03-29 ENCOUNTER — Ambulatory Visit: Payer: BC Managed Care – PPO | Attending: Family Medicine

## 2023-03-29 DIAGNOSIS — R252 Cramp and spasm: Secondary | ICD-10-CM

## 2023-03-29 DIAGNOSIS — R293 Abnormal posture: Secondary | ICD-10-CM

## 2023-03-29 DIAGNOSIS — G8929 Other chronic pain: Secondary | ICD-10-CM

## 2023-03-29 DIAGNOSIS — M25511 Pain in right shoulder: Secondary | ICD-10-CM | POA: Insufficient documentation

## 2023-03-29 DIAGNOSIS — M6281 Muscle weakness (generalized): Secondary | ICD-10-CM

## 2023-03-29 NOTE — Therapy (Signed)
OUTPATIENT PHYSICAL THERAPY SHOULDER TREATMENT NOTE   Patient Name: Terri Salazar MRN: [id number] DOB:18-Aug-1961, 62 y.o., female Today's Date: 03/29/2023  END OF SESSION:  PT End of Session - 03/29/23 0847     Visit Number 2    Date for PT Re-Evaluation 05/11/23    Authorization Type BCBS    PT Start Time 0848    PT Stop Time 0924    PT Time Calculation (min) 36 min    Activity Tolerance Patient tolerated treatment well    Behavior During Therapy WFL for tasks assessed/performed             Past Medical History:  Diagnosis Date   Allergy    Cancer (HCC)    Right bundle branch block    since 62 years old   Past Surgical History:  Procedure Laterality Date   ABDOMINAL HYSTERECTOMY     COLONOSCOPY     ELBOW SURGERY Right    LAPAROSCOPIC PARTIAL COLECTOMY N/A 09/13/2015   Procedure: LAPAROSCOPIC LOW ANTERIOR RESECTION OF RECTUM WITH MOBILIZATION OF SPLENIC FLEXURE;  Surgeon: Avel Peace, MD;  Location: [facility];  Service: General;  Laterality: N/A;   Patient Active Problem List   Diagnosis Date Noted   Fracture, humerus, greater tuberosity 09/11/2019   Rectal cancer [facility]) s/p lap assisted LAR 09/13/15 09/13/2015    PCP: Chaya Jan, MD  REFERRING PROVIDER: [place], Betty, MD  REFERRING DIAG: M25.511 (ICD-10-CM) - Right shoulder pain, unspecified chronicity   THERAPY DIAG:  Chronic right shoulder pain  Abnormal posture  Muscle weakness (generalized)  Cramp and spasm  Rationale for Evaluation and Treatment: Rehabilitation  ONSET DATE: 4 months ago   SUBJECTIVE:                                                                                                                                                                                      SUBJECTIVE STATEMENT: Patient reports she was traveling since last visit and did not have the chance to do her exercises.  "I did a lot of hauling luggage and putting things overhead, so it is a little  achy"  She rates her pain at 3/10 today.     PERTINENT HISTORY: Lt humerus fracture, rectal cancer   PAIN:   03/29/23 Are you having pain? Yes: NPRS scale: 3/10 Pain location: Rt shoulder to deltoid Pain description: intermittent, shooting  Aggravating factors: taking shirt on and off, reaching across body, cleaning sink Relieving factors: stopping the aggravating activity, pain meds  PRECAUTIONS: Other: history of cancer   WEIGHT BEARING RESTRICTIONS: No  FALLS:  Has patient fallen in last 6 months? No  LIVING ENVIRONMENT: Lives  with: lives with their spouse Lives in: House/apartment  OCCUPATION: Does not work   PLOF: Independent, Scientific laboratory technician, Leisure: 2 miles of walking daily, gardening.   PATIENT GOALS:reduce Rt shoulder pain  NEXT MD VISIT:   OBJECTIVE:   DIAGNOSTIC FINDINGS:  None recent   PATIENT SURVEYS:  03/13/23: FOTO 64, goal is 92  COGNITION: Overall cognitive status: Within functional limits for tasks assessed     SENSATION: WFL  POSTURE: Mild rounded shoulder posture, scapular elevation with Rt UE flexion and abduction   UPPER EXTREMITY MMT:  Rt=Lt and 4+/5 to 5/5 throughout.  Pain with resisted flexion and ER  UPPER EXTREMITY ROM:  ROM is all WFLs with Rt=Lt.  Pt with scapular elevation with flexion, abduction and horizontal adduction on the Rt  SHOULDER SPECIAL TESTS: Impingement tests: Neer impingement test: positive   JOINT MOBILITY TESTING:  normal  PALPATION:  Palpable tenderness over Rt anterior glenohumeral joint and external rotators.  Normal joint mobility     TODAY'S TREATMENT:                                                                                                                                         DATE: 03/29/23 UBE x 6 min 3 way scapular stabilization x 10 right 4 D ball rolls x 20 each direction right Reviewed HEP Added prone shoulder extension, rows, horizontal abduction with 0 lb x 20 Added  sidelying ER with 0 lb x 20 Added supine serratus punch with 0 lb x 20 Educated on pain control and use of ice for preventing inflammation after exercise or heavy UE work such as housework vs using only once pain is present.   Ice to right shoulder in supine with bolster under knees x 10 min  DATE: 03/13/23 HEP established-see below    PATIENT EDUCATION: Education details: [email or web address] Person educated: Patient Education method: Programmer, multimedia, Facilities manager, and Handouts Education comprehension: verbalized understanding and returned demonstration  HOME EXERCISE PROGRAM: Access Code: [email or web address] URL: [email or web address] Date: 03/29/2023 Prepared by: Mikey Kirschner  Exercises - Shoulder Flexion Wall Slide with Towel  - 2 x daily - 7 x weekly - 1 sets - 10 reps - 5 hold - Standing Shoulder Abduction Slides at Wall  - 2 x daily - 7 x weekly - 1 sets - 10 reps - 5 hold - Standing Shoulder External Rotation with Resistance  - 2 x daily - 7 x weekly - 2 sets - 10 reps - Seated Correct Posture  - 1 x daily - 7 x weekly - 3 sets - 10 reps - Seated Shoulder Flexion  - 2 x daily - 7 x weekly - 2 sets - 10 reps - Supine Shoulder Horizontal Abduction and Adduction  - 2 x daily - 7 x weekly - 2 sets - 10 reps - Prone Shoulder Extension - Single Arm  - 2 x daily - 7 x weekly -  2 sets - 10 reps - Prone Shoulder Row  - 2 x daily - 7 x weekly - 2 sets - 10 reps - Prone Single Arm Shoulder Horizontal Abduction with Scapular Retraction and Palm Down  - 2 x daily - 7 x weekly - 2 sets - 10 reps - Sidelying Shoulder External Rotation  - 2 x daily - 7 x weekly - 2 sets - 10 reps - Single Arm Serratus Punches in Supine with Dumbbell  - 2 x daily - 7 x weekly - 2 sets - 10 reps  ASSESSMENT:  CLINICAL IMPRESSION: Terri Salazar was able to do all exercises and tasks today with minimal pain.  She has good ROM but does show signs of mild impingement and RC tendinitis.  We added prone shoulder exercises for  further scapulohumeral strengthening.  We finished with ice to control any soreness and encouraged her to do ice at home as well.   Patient will benefit from continued skilled PT to address the below impairments and improve overall function.   OBJECTIVE IMPAIRMENTS: decreased activity tolerance, decreased ROM, decreased strength, impaired flexibility, impaired UE functional use, postural dysfunction, and pain.   ACTIVITY LIMITATIONS: carrying, lifting, dressing, reach over head, and hygiene/grooming  PARTICIPATION LIMITATIONS: meal prep and cleaning  PERSONAL FACTORS: Time since onset of injury/illness/exacerbation are also affecting patient's functional outcome.   REHAB POTENTIAL: Good  CLINICAL DECISION MAKING: Stable/uncomplicated  EVALUATION COMPLEXITY: Low   GOALS: Goals reviewed with patient? Yes  SHORT TERM GOALS: Target date: 04/10/2023    Be independent in initial HEP Baseline: Goal status: INITIAL  2.  Report > or = to 30% reduction in Rt UE pain with functional use  Baseline:  Goal status: INITIAL  3.  Verbalize and demonstrate postural corrections to scapular depression and retraction with use of Rt UE Baseline:  Goal status: INITIAL   LONG TERM GOALS: Target date: 05/11/23  Be independent in advanced HEP Baseline:  Goal status: INITIAL  2.  Improve FOTO to > or = to 70 Baseline: 64 Goal status: INITIAL  3.  Report > or = to 70% reduction in Rt UE pain with functional use Baseline:  Goal status: INITIAL  4.  Reach overhead and across body for self-care without scapular elevation Baseline:  Goal status: INITIAL  5.  Demonstrate Rt UE flexion without painful arc or scapular elevation Baseline:  Goal status: INITIAL  PLAN:  PT FREQUENCY: 1-2x/week  PT DURATION: 8 weeks  PLANNED INTERVENTIONS: Therapeutic exercises, Therapeutic activity, Neuromuscular re-education, Balance training, Gait training, Patient/Family education, Self Care, Joint  mobilization, Aquatic Therapy, Dry Needling, Electrical stimulation, Cryotherapy, Moist heat, Taping, Ionotophoresis 4mg /ml Dexamethasone, Manual therapy, and Re-evaluation  PLAN FOR NEXT SESSION: Continue shoulder and scapular strengthening, shoulder ROM with emphasis on mechanics, thoracic mobility    Lorrene Reid, PT 03/29/23 9:18 AM   [facility] [address] [place], [place] [address] Phone # [phone] Fax [phone]

## 2023-04-05 ENCOUNTER — Ambulatory Visit: Payer: BC Managed Care – PPO

## 2023-04-05 DIAGNOSIS — R252 Cramp and spasm: Secondary | ICD-10-CM

## 2023-04-05 DIAGNOSIS — M25511 Pain in right shoulder: Secondary | ICD-10-CM | POA: Diagnosis not present

## 2023-04-05 DIAGNOSIS — G8929 Other chronic pain: Secondary | ICD-10-CM

## 2023-04-05 DIAGNOSIS — M6281 Muscle weakness (generalized): Secondary | ICD-10-CM

## 2023-04-05 DIAGNOSIS — R293 Abnormal posture: Secondary | ICD-10-CM

## 2023-04-05 NOTE — Therapy (Signed)
OUTPATIENT PHYSICAL THERAPY SHOULDER TREATMENT NOTE   Patient Name: COSSETTE GATSON MRN: [id number] DOB:1961-03-27, 62 y.o., female Today's Date: 04/05/2023  END OF SESSION:  PT End of Session - 04/05/23 0851     Visit Number 3    Date for PT Re-Evaluation 05/11/23    Authorization Type BCBS    PT Start Time [phone]    PT Stop Time 0922    PT Time Calculation (min) 30 min    Activity Tolerance Patient tolerated treatment well    Behavior During Therapy [facility] for tasks assessed/performed             Past Medical History:  Diagnosis Date   Allergy    Cancer (HCC)    Right bundle branch block    since 62 years old   Past Surgical History:  Procedure Laterality Date   ABDOMINAL HYSTERECTOMY     COLONOSCOPY     ELBOW SURGERY Right    LAPAROSCOPIC PARTIAL COLECTOMY N/A 09/13/2015   Procedure: LAPAROSCOPIC LOW ANTERIOR RESECTION OF RECTUM WITH MOBILIZATION OF SPLENIC FLEXURE;  Surgeon: Avel Peace, MD;  Location: [facility];  Service: General;  Laterality: N/A;   Patient Active Problem List   Diagnosis Date Noted   Fracture, humerus, greater tuberosity 09/11/2019   Rectal cancer [facility]) s/p lap assisted LAR 09/13/15 09/13/2015    PCP: Chaya Jan, MD  REFERRING PROVIDER: [place], Betty, MD  REFERRING DIAG: M25.511 (ICD-10-CM) - Right shoulder pain, unspecified chronicity   THERAPY DIAG:  Chronic right shoulder pain  Abnormal posture  Muscle weakness (generalized)  Cramp and spasm  Rationale for Evaluation and Treatment: Rehabilitation  ONSET DATE: 4 months ago   SUBJECTIVE:                                                                                                                                                                                      SUBJECTIVE STATEMENT: Patient reports she had to scrub her front porch with a hand scrubber and her right shoulder is sore.  But, she reports that the overall pain is improved.   She rates her pain at  2/10 today.     PERTINENT HISTORY: Lt humerus fracture, rectal cancer   PAIN:   04/05/23 Are you having pain? Yes: NPRS scale: 2/10 Pain location: Rt shoulder to deltoid Pain description: intermittent, shooting  Aggravating factors: taking shirt on and off, reaching across body, cleaning sink Relieving factors: stopping the aggravating activity, pain meds  PRECAUTIONS: Other: history of cancer   WEIGHT BEARING RESTRICTIONS: No  FALLS:  Has patient fallen in last 6 months? No  LIVING ENVIRONMENT: Lives with: lives with their spouse Lives  in: House/apartment  OCCUPATION: Does not work   PLOF: Independent, Vocation/Vocational, Leisure: 2 miles of walking daily, gardening.   PATIENT GOALS:reduce Rt shoulder pain  NEXT MD VISIT:   OBJECTIVE:   DIAGNOSTIC FINDINGS:  None recent   PATIENT SURVEYS:  03/13/23: FOTO 64, goal is 81  COGNITION: Overall cognitive status: Within functional limits for tasks assessed     SENSATION: WFL  POSTURE: Mild rounded shoulder posture, scapular elevation with Rt UE flexion and abduction   UPPER EXTREMITY MMT:  Rt=Lt and 4+/5 to 5/5 throughout.  Pain with resisted flexion and ER  UPPER EXTREMITY ROM:  ROM is all WFLs with Rt=Lt.  Pt with scapular elevation with flexion, abduction and horizontal adduction on the Rt  SHOULDER SPECIAL TESTS: Impingement tests: Neer impingement test: positive   JOINT MOBILITY TESTING:  normal  PALPATION:  Palpable tenderness over Rt anterior glenohumeral joint and external rotators.  Normal joint mobility     TODAY'S TREATMENT:                                                                                                                                         DATE: 04/05/23 UBE x 6 min (3/3) 3 way scapular stabilization x 10 right 4 D ball rolls x 20 each direction right Lower trap lift off facing wall x 20 Prone shoulder extension, rows, horizontal abduction with 1 lb x 20 Sidelying ER with  1 lb x 20 Added supine serratus punch with 1 lb x 20 Shoulder alphabet A-Z Standing shoulder flexion and scaption x 20 with 1 lb Ice to right shoulder in supine with bolster under knees x 10 min  DATE: 03/29/23 UBE x 6 min 3 way scapular stabilization x 10 right 4 D ball rolls x 20 each direction right Reviewed HEP Added prone shoulder extension, rows, horizontal abduction with 0 lb x 20 Added sidelying ER with 0 lb x 20 Added supine serratus punch with 0 lb x 20 Educated on pain control and use of ice for preventing inflammation after exercise or heavy UE work such as housework vs using only once pain is present.   Ice to right shoulder in supine with bolster under knees x 10 min  DATE: 03/13/23 HEP established-see below    PATIENT EDUCATION: Education details: [email or web address] Person educated: Patient Education method: Programmer, multimedia, Facilities manager, and Handouts Education comprehension: verbalized understanding and returned demonstration  HOME EXERCISE PROGRAM: Access Code: [email or web address] URL: [email or web address] Date: 03/29/2023 Prepared by: Mikey Kirschner  Exercises - Shoulder Flexion Wall Slide with Towel  - 2 x daily - 7 x weekly - 1 sets - 10 reps - 5 hold - Standing Shoulder Abduction Slides at Wall  - 2 x daily - 7 x weekly - 1 sets - 10 reps - 5 hold - Standing Shoulder External Rotation with Resistance  - 2 x daily - 7 x weekly - 2 sets -  10 reps - Seated Correct Posture  - 1 x daily - 7 x weekly - 3 sets - 10 reps - Seated Shoulder Flexion  - 2 x daily - 7 x weekly - 2 sets - 10 reps - Supine Shoulder Horizontal Abduction and Adduction  - 2 x daily - 7 x weekly - 2 sets - 10 reps - Prone Shoulder Extension - Single Arm  - 2 x daily - 7 x weekly - 2 sets - 10 reps - Prone Shoulder Row  - 2 x daily - 7 x weekly - 2 sets - 10 reps - Prone Single Arm Shoulder Horizontal Abduction with Scapular Retraction and Palm Down  - 2 x daily - 7 x weekly - 2 sets - 10 reps -  Sidelying Shoulder External Rotation  - 2 x daily - 7 x weekly - 2 sets - 10 reps - Single Arm Serratus Punches in Supine with Dumbbell  - 2 x daily - 7 x weekly - 2 sets - 10 reps  ASSESSMENT:  CLINICAL IMPRESSION: Skyah was able to tolerate addition of 1 lb on all exercises without increase in pain.  She is maintaining good ROM but does continue to have signs of mild impingement and RC tendinitis.  We added resistance to her prone shoulder exercises for further scapulohumeral strengthening.  She declined ice today.  Suggested she use it once she gets home if she experiences any soreness or pain.  Patient will benefit from continued skilled PT to address the below impairments and improve overall function.   OBJECTIVE IMPAIRMENTS: decreased activity tolerance, decreased ROM, decreased strength, impaired flexibility, impaired UE functional use, postural dysfunction, and pain.   ACTIVITY LIMITATIONS: carrying, lifting, dressing, reach over head, and hygiene/grooming  PARTICIPATION LIMITATIONS: meal prep and cleaning  PERSONAL FACTORS: Time since onset of injury/illness/exacerbation are also affecting patient's functional outcome.   REHAB POTENTIAL: Good  CLINICAL DECISION MAKING: Stable/uncomplicated  EVALUATION COMPLEXITY: Low   GOALS: Goals reviewed with patient? Yes  SHORT TERM GOALS: Target date: 04/10/2023    Be independent in initial HEP Baseline: Goal status: INITIAL  2.  Report > or = to 30% reduction in Rt UE pain with functional use  Baseline:  Goal status: INITIAL  3.  Verbalize and demonstrate postural corrections to scapular depression and retraction with use of Rt UE Baseline:  Goal status: INITIAL   LONG TERM GOALS: Target date: 05/11/23  Be independent in advanced HEP Baseline:  Goal status: INITIAL  2.  Improve FOTO to > or = to 70 Baseline: 64 Goal status: INITIAL  3.  Report > or = to 70% reduction in Rt UE pain with functional use Baseline:  Goal  status: INITIAL  4.  Reach overhead and across body for self-care without scapular elevation Baseline:  Goal status: INITIAL  5.  Demonstrate Rt UE flexion without painful arc or scapular elevation Baseline:  Goal status: INITIAL  PLAN:  PT FREQUENCY: 1-2x/week  PT DURATION: 8 weeks  PLANNED INTERVENTIONS: Therapeutic exercises, Therapeutic activity, Neuromuscular re-education, Balance training, Gait training, Patient/Family education, Self Care, Joint mobilization, Aquatic Therapy, Dry Needling, Electrical stimulation, Cryotherapy, Moist heat, Taping, Ionotophoresis 4mg /ml Dexamethasone, Manual therapy, and Re-evaluation  PLAN FOR NEXT SESSION: Progress shoulder and scapular strengthening, adding resistance as tolerated, shoulder ROM with emphasis on mechanics, thoracic mobility    Jamear Carbonneau B. Kelvin Burpee, PT 04/05/23 9:22 AM  [facility] [address], [address] [place], [place] [address] Phone # [phone] Fax [phone]

## 2023-04-12 ENCOUNTER — Ambulatory Visit: Payer: BC Managed Care – PPO

## 2023-04-12 DIAGNOSIS — R252 Cramp and spasm: Secondary | ICD-10-CM

## 2023-04-12 DIAGNOSIS — R293 Abnormal posture: Secondary | ICD-10-CM

## 2023-04-12 DIAGNOSIS — G8929 Other chronic pain: Secondary | ICD-10-CM

## 2023-04-12 DIAGNOSIS — M6281 Muscle weakness (generalized): Secondary | ICD-10-CM

## 2023-04-12 DIAGNOSIS — M25511 Pain in right shoulder: Secondary | ICD-10-CM | POA: Diagnosis not present

## 2023-04-12 NOTE — Therapy (Signed)
OUTPATIENT PHYSICAL THERAPY SHOULDER TREATMENT NOTE   Patient Name: Terri Salazar MRN: [id number] DOB:[birth year], 62 y.o., female Today's Date: 04/12/2023  END OF SESSION:  PT End of Session - 04/12/23 0924     Visit Number 4    Date for PT Re-Evaluation 05/11/23    Authorization Type BCBS    PT Start Time 0848    PT Stop Time 0924    PT Time Calculation (min) 36 min    Activity Tolerance Patient tolerated treatment well    Behavior During Therapy WFL for tasks assessed/performed              Past Medical History:  Diagnosis Date   Allergy    Cancer (HCC)    Right bundle branch block    since 62 years old   Past Surgical History:  Procedure Laterality Date   ABDOMINAL HYSTERECTOMY     COLONOSCOPY     ELBOW SURGERY Right    LAPAROSCOPIC PARTIAL COLECTOMY N/A 09/13/2015   Procedure: LAPAROSCOPIC LOW ANTERIOR RESECTION OF RECTUM WITH MOBILIZATION OF SPLENIC FLEXURE;  Surgeon: Avel Peace, MD;  Location: [facility];  Service: General;  Laterality: N/A;   Patient Active Problem List   Diagnosis Date Noted   Fracture, humerus, greater tuberosity 09/11/2019   Rectal cancer [facility]) s/p lap assisted LAR 09/13/15 09/13/2015    PCP: Chaya Jan, MD  REFERRING PROVIDER: [place], Betty, MD  REFERRING DIAG: M25.511 (ICD-10-CM) - Right shoulder pain, unspecified chronicity   THERAPY DIAG:  Chronic right shoulder pain  Abnormal posture  Muscle weakness (generalized)  Cramp and spasm  Rationale for Evaluation and Treatment: Rehabilitation  ONSET DATE: 4 months ago   SUBJECTIVE:                                                                                                                                                                                      SUBJECTIVE STATEMENT: Sharp pain is 20% less frequent.  I'm now feeling an ache in my shoulder that feels deep    PERTINENT HISTORY: Lt humerus fracture, rectal cancer   PAIN:   04/05/23 Are you  having pain? Yes: NPRS scale: 2/10 Pain location: Rt shoulder to deltoid Pain description: intermittent, shooting, deep ache Aggravating factors: taking shirt on and off, reaching across body, cleaning sink Relieving factors: stopping the aggravating activity, pain meds  PRECAUTIONS: Other: history of cancer   WEIGHT BEARING RESTRICTIONS: No  FALLS:  Has patient fallen in last 6 months? No  LIVING ENVIRONMENT: Lives with: lives with their spouse Lives in: House/apartment  OCCUPATION: Does not work   PLOF: Independent, Scientific laboratory technician, Leisure: 2 miles of walking daily, gardening.  PATIENT GOALS:reduce Rt shoulder pain  NEXT MD VISIT:   OBJECTIVE:   DIAGNOSTIC FINDINGS:  None recent   PATIENT SURVEYS:  03/13/23: FOTO 64, goal is 51  COGNITION: Overall cognitive status: Within functional limits for tasks assessed     SENSATION: WFL  POSTURE: Mild rounded shoulder posture, scapular elevation with Rt UE flexion and abduction   UPPER EXTREMITY MMT:  Rt=Lt and 4+/5 to 5/5 throughout.  Pain with resisted flexion and ER  UPPER EXTREMITY ROM:  ROM is all WFLs with Rt=Lt.  Pt with scapular elevation with flexion, abduction and horizontal adduction on the Rt  SHOULDER SPECIAL TESTS: Impingement tests: Neer impingement test: positive   JOINT MOBILITY TESTING:  normal  PALPATION:  Palpable tenderness over Rt anterior glenohumeral joint and external rotators.  Normal joint mobility     TODAY'S TREATMENT:      DATE: 04/12/23 UBE 1.4 x 6 min (3/3)-PT present to discuss progress  3 way scapular stabilization x 10 right 4 D ball rolls x 20 each direction right Lower trap lift off facing wall x 20 Prone shoulder extension, rows, horizontal abduction with 1 lb x 20 Sidelying ER with 1 lb x 20 Added supine serratus punch with 1 lb x 20 Shoulder alphabet A-Z Standing shoulder flexion and scaption x 20 with 1 lb                                                                                                                                       DATE: 04/05/23 UBE x 6 min (3/3) 3 way scapular stabilization x 10 right 4 D ball rolls x 20 each direction right Lower trap lift off facing wall x 20 Prone shoulder extension, rows, horizontal abduction with 1 lb x 20 Sidelying ER with 1 lb x 20 Added supine serratus punch with 1 lb x 20 Shoulder alphabet A-Z Standing shoulder flexion and scaption x 20 with 1 lb Ice to right shoulder in supine with bolster under knees x 10 min  DATE: 03/29/23 UBE x 6 min 3 way scapular stabilization x 10 right 4 D ball rolls x 20 each direction right Reviewed HEP Added prone shoulder extension, rows, horizontal abduction with 0 lb x 20 Added sidelying ER with 0 lb x 20 Added supine serratus punch with 0 lb x 20 Educated on pain control and use of ice for preventing inflammation after exercise or heavy UE work such as housework vs using only once pain is present.   Ice to right shoulder in supine with bolster under knees x 10 min    PATIENT EDUCATION: Education details: [email or web address] Person educated: Patient Education method: Programmer, multimedia, Facilities manager, and Handouts Education comprehension: verbalized understanding and returned demonstration  HOME EXERCISE PROGRAM: Access Code: [email or web address] URL: [email or web address] Date: 03/29/2023 Prepared by: Mikey Kirschner  Exercises - Shoulder Flexion Wall Slide with Towel  - 2 x daily - 7 x weekly - 1  sets - 10 reps - 5 hold - Standing Shoulder Abduction Slides at Wall  - 2 x daily - 7 x weekly - 1 sets - 10 reps - 5 hold - Standing Shoulder External Rotation with Resistance  - 2 x daily - 7 x weekly - 2 sets - 10 reps - Seated Correct Posture  - 1 x daily - 7 x weekly - 3 sets - 10 reps - Seated Shoulder Flexion  - 2 x daily - 7 x weekly - 2 sets - 10 reps - Supine Shoulder Horizontal Abduction and Adduction  - 2 x daily - 7 x weekly - 2 sets - 10 reps - Prone  Shoulder Extension - Single Arm  - 2 x daily - 7 x weekly - 2 sets - 10 reps - Prone Shoulder Row  - 2 x daily - 7 x weekly - 2 sets - 10 reps - Prone Single Arm Shoulder Horizontal Abduction with Scapular Retraction and Palm Down  - 2 x daily - 7 x weekly - 2 sets - 10 reps - Sidelying Shoulder External Rotation  - 2 x daily - 7 x weekly - 2 sets - 10 reps - Single Arm Serratus Punches in Supine with Dumbbell  - 2 x daily - 7 x weekly - 2 sets - 10 reps  ASSESSMENT:  CLINICAL IMPRESSION: Pt reports 20% reduction in sharp pain with reaching.  She is experiencing a deep ache in the shoulder now at rest that was not present before.   Pt required minor tactile cues for scapular depression when she begins to fatigue. No increased pain with exercises today and tolerated increased weights with serratus punches and supine shoulder alphabet.  Patient will benefit from continued skilled PT to address the below impairments and improve overall function.   OBJECTIVE IMPAIRMENTS: decreased activity tolerance, decreased ROM, decreased strength, impaired flexibility, impaired UE functional use, postural dysfunction, and pain.   ACTIVITY LIMITATIONS: carrying, lifting, dressing, reach over head, and hygiene/grooming  PARTICIPATION LIMITATIONS: meal prep and cleaning  PERSONAL FACTORS: Time since onset of injury/illness/exacerbation are also affecting patient's functional outcome.   REHAB POTENTIAL: Good  CLINICAL DECISION MAKING: Stable/uncomplicated  EVALUATION COMPLEXITY: Low   GOALS: Goals reviewed with patient? Yes  SHORT TERM GOALS: Target date: 04/10/2023    Be independent in initial HEP Baseline: not consistent (04/12/23) Goal status: IN PROGRESS  2.  Report > or = to 30% reduction in Rt UE pain with functional use  Baseline: 20% (04/12/23) Goal status: IN PROGRESS  3.  Verbalize and demonstrate postural corrections to scapular depression and retraction with use of Rt UE Baseline:  working on this (04/12/23) Goal status: IN PROGRESS   LONG TERM GOALS: Target date: 05/11/23  Be independent in advanced HEP Baseline:  Goal status: INITIAL  2.  Improve FOTO to > or = to 70 Baseline: 64 Goal status: INITIAL  3.  Report > or = to 70% reduction in Rt UE pain with functional use Baseline:  Goal status: INITIAL  4.  Reach overhead and across body for self-care without scapular elevation Baseline:  Goal status: INITIAL  5.  Demonstrate Rt UE flexion without painful arc or scapular elevation Baseline:  Goal status: INITIAL  PLAN:  PT FREQUENCY: 1-2x/week  PT DURATION: 8 weeks  PLANNED INTERVENTIONS: Therapeutic exercises, Therapeutic activity, Neuromuscular re-education, Balance training, Gait training, Patient/Family education, Self Care, Joint mobilization, Aquatic Therapy, Dry Needling, Electrical stimulation, Cryotherapy, Moist heat, Taping, Ionotophoresis 4mg /ml Dexamethasone, Manual therapy, and  Re-evaluation  PLAN FOR NEXT SESSION: Progress shoulder and scapular strengthening, adding resistance as tolerated, shoulder ROM with emphasis on mechanics, thoracic mobility.  Pt will be on vacation and will return 05/03/23   Lorrene Reid, PT 04/12/23 9:25 AM  [facility] [address] [place], [place] [address] Phone # [phone] Fax [phone]

## 2023-04-21 ENCOUNTER — Encounter: Payer: Self-pay | Admitting: Internal Medicine

## 2023-04-25 ENCOUNTER — Other Ambulatory Visit: Payer: Self-pay | Admitting: Internal Medicine

## 2023-04-25 DIAGNOSIS — M25511 Pain in right shoulder: Secondary | ICD-10-CM

## 2023-05-10 ENCOUNTER — Ambulatory Visit: Payer: BC Managed Care – PPO

## 2023-05-31 ENCOUNTER — Other Ambulatory Visit: Payer: Self-pay | Admitting: Internal Medicine

## 2023-05-31 DIAGNOSIS — M25511 Pain in right shoulder: Secondary | ICD-10-CM

## 2023-06-05 ENCOUNTER — Ambulatory Visit: Payer: BC Managed Care – PPO | Admitting: Family Medicine

## 2023-06-06 ENCOUNTER — Ambulatory Visit: Payer: BC Managed Care – PPO | Admitting: Internal Medicine

## 2023-06-06 ENCOUNTER — Ambulatory Visit (INDEPENDENT_AMBULATORY_CARE_PROVIDER_SITE_OTHER): Payer: BC Managed Care – PPO

## 2023-06-06 ENCOUNTER — Encounter: Payer: Self-pay | Admitting: Internal Medicine

## 2023-06-06 VITALS — BP 110/80 | HR 71 | Wt 131.0 lb

## 2023-06-06 DIAGNOSIS — R053 Chronic cough: Secondary | ICD-10-CM

## 2023-06-06 DIAGNOSIS — M25511 Pain in right shoulder: Secondary | ICD-10-CM | POA: Diagnosis not present

## 2023-06-06 DIAGNOSIS — G8929 Other chronic pain: Secondary | ICD-10-CM | POA: Diagnosis not present

## 2023-06-06 NOTE — Progress Notes (Signed)
Established Patient Office Visit     CC/Reason for Visit: Discuss acute concerns  HPI: Terri Salazar is a 62 y.o. female who is coming in today for the above mentioned reasons.  Here to discuss 2 main issues:  1.  She has been experiencing a chronic cough for about 3 months.  Started after a sinus infection but has not resolved since.  Cough is mildly productive.  She is concerned as she had prolonged asbestos exposure in 2003.  2.  Her right shoulder is bothering her.  She was evaluated for this in April and sent to physical therapy which she has completed without much relief.  She takes NSAIDs sporadically for it.  Range of motion does not appear to be affected.   Past Medical/Surgical History: Past Medical History:  Diagnosis Date   Allergy    Cancer (HCC)    Right bundle branch block    since 62 years old    Past Surgical History:  Procedure Laterality Date   ABDOMINAL HYSTERECTOMY     COLONOSCOPY     ELBOW SURGERY Right    LAPAROSCOPIC PARTIAL COLECTOMY N/A 09/13/2015   Procedure: LAPAROSCOPIC LOW ANTERIOR RESECTION OF RECTUM WITH MOBILIZATION OF SPLENIC FLEXURE;  Surgeon: Avel Peace, MD;  Location: [facility];  Service: General;  Laterality: N/A;    Social History:  reports that she has never smoked. She has never used smokeless tobacco. She reports current alcohol use. She reports that she does not use drugs.  Allergies: No Known Allergies  Family History:  History reviewed. No pertinent family history.   Current Outpatient Medications:    diphenhydrAMINE (BENADRYL) 25 mg capsule, Take 25 mg by mouth at bedtime as needed for sleep. , Disp: , Rfl:    meloxicam (MOBIC) 7.5 MG tablet, TAKE 1 TABLET BY MOUTH EVERY DAY, Disp: 30 tablet, Rfl: 0   Multiple Vitamins-Minerals (MULTI FOR HER PO), Take 1 tablet by mouth daily. , Disp: , Rfl:    Polyethylene Glycol 3350 (MIRALAX PO), Take 17 g by mouth daily as needed (constipation). , Disp: , Rfl:     pseudoephedrine (SUDAFED) 30 MG tablet, Take 30 mg by mouth every 4 (four) hours as needed for congestion., Disp: , Rfl:   Review of Systems:  Negative unless indicated in HPI.   Physical Exam: Vitals:   06/06/23 1330  BP: 110/80  Pulse: 71  SpO2: 97%  Weight: 131 lb (59.4 kg)    Body mass index is 19.92 kg/m.   Physical Exam Vitals reviewed.  Constitutional:      Appearance: Normal appearance.  HENT:     Head: Normocephalic and atraumatic.  Eyes:     Conjunctiva/sclera: Conjunctivae normal.     Pupils: Pupils are equal, round, and reactive to light.  Cardiovascular:     Rate and Rhythm: Normal rate and regular rhythm.  Pulmonary:     Effort: Pulmonary effort is normal.     Breath sounds: Normal breath sounds.  Skin:    General: Skin is warm and dry.  Neurological:     General: No focal deficit present.     Mental Status: She is alert and oriented to person, place, and time.  Psychiatric:        Mood and Affect: Mood normal.        Behavior: Behavior normal.        Thought Content: Thought content normal.        Judgment: Judgment normal.  Impression and Plan:  Chronic right shoulder pain -     Ambulatory referral to Orthopedic Surgery  Chronic cough -     DG Chest 2 View; Future   -Given chronicity of cough and history of prolonged asbestos exposure dating back 20 years, I feel it is reasonable to check chest x-ray. -For her right shoulder pain I will go ahead and place a referral to orthopedic surgery.  She has not had imaging yet.  She has completed physical therapy without much relief.  Time spent:30 minutes reviewing chart, interviewing and examining patient and formulating plan of care.     Chaya Jan, MD [facility] at [facility]

## 2023-06-11 ENCOUNTER — Encounter: Payer: Self-pay | Admitting: Internal Medicine

## 2023-06-11 ENCOUNTER — Telehealth: Payer: Self-pay | Admitting: Internal Medicine

## 2023-06-11 NOTE — Telephone Encounter (Signed)
Responded via MyChart.

## 2023-06-11 NOTE — Telephone Encounter (Signed)
Calling for results from xrays taken last week

## 2023-09-24 ENCOUNTER — Ambulatory Visit (INDEPENDENT_AMBULATORY_CARE_PROVIDER_SITE_OTHER): Payer: BC Managed Care – PPO | Admitting: Internal Medicine

## 2023-09-24 ENCOUNTER — Encounter: Payer: Self-pay | Admitting: Internal Medicine

## 2023-09-24 VITALS — BP 110/80 | HR 64 | Temp 97.8°F | Wt 137.2 lb

## 2023-09-24 DIAGNOSIS — M76891 Other specified enthesopathies of right lower limb, excluding foot: Secondary | ICD-10-CM | POA: Diagnosis not present

## 2023-09-24 NOTE — Progress Notes (Signed)
     Established Patient Office Visit     CC/Reason for Visit: Right groin pain  HPI: Terri Salazar is a 62 y.o. female who is coming in today for the above mentioned reasons.  For approximately 6 weeks she has been dealing with some low-grade right groin pain.  No injuries that she can recall.  She did recently returned from a trip to [place] where there was a lot of hiking.  Past Medical/Surgical History: Past Medical History:  Diagnosis Date   Allergy    Cancer (HCC)    Right bundle branch block    since 62 years old    Past Surgical History:  Procedure Laterality Date   ABDOMINAL HYSTERECTOMY     COLONOSCOPY     ELBOW SURGERY Right    LAPAROSCOPIC PARTIAL COLECTOMY N/A 09/13/2015   Procedure: LAPAROSCOPIC LOW ANTERIOR RESECTION OF RECTUM WITH MOBILIZATION OF SPLENIC FLEXURE;  Surgeon: Avel Peace, MD;  Location: [facility];  Service: General;  Laterality: N/A;    Social History:  reports that she has never smoked. She has never used smokeless tobacco. She reports current alcohol use. She reports that she does not use drugs.  Allergies: No Known Allergies  Family History:  History reviewed. No pertinent family history.   Current Outpatient Medications:    diphenhydrAMINE (BENADRYL) 25 mg capsule, Take 25 mg by mouth at bedtime as needed for sleep. , Disp: , Rfl:    meloxicam (MOBIC) 7.5 MG tablet, TAKE 1 TABLET BY MOUTH EVERY DAY, Disp: 30 tablet, Rfl: 0   Multiple Vitamins-Minerals (MULTI FOR HER PO), Take 1 tablet by mouth daily. , Disp: , Rfl:    Polyethylene Glycol 3350 (MIRALAX PO), Take 17 g by mouth daily as needed (constipation). , Disp: , Rfl:    pseudoephedrine (SUDAFED) 30 MG tablet, Take 30 mg by mouth every 4 (four) hours as needed for congestion., Disp: , Rfl:   Review of Systems:  Negative unless indicated in HPI.   Physical Exam: Vitals:   09/24/23 1528  BP: 110/80  Pulse: 64  Temp: 97.8 F (36.6 C)  TempSrc: Oral  SpO2: 98%  Weight: 137  lb 3.2 oz (62.2 kg)    Body mass index is 20.86 kg/m.   Physical Exam Abdominal:     Hernia: There is no hernia in the right inguinal area.  Lymphadenopathy:     Lower Body: No right inguinal adenopathy.      Impression and Plan:  Hip flexor tendinitis, right   -Suspect this to be a hip flexor tendinitis.  Have advised 10 days of meloxicam that she already has at home and some rehab exercises that I have provided.  Time spent:23 minutes reviewing chart, interviewing and examining patient and formulating plan of care.     Chaya Jan, MD [facility] at [facility]

## 2023-10-06 ENCOUNTER — Encounter: Payer: Self-pay | Admitting: Internal Medicine

## 2023-10-06 DIAGNOSIS — M76891 Other specified enthesopathies of right lower limb, excluding foot: Secondary | ICD-10-CM

## 2023-10-06 DIAGNOSIS — M545 Low back pain, unspecified: Secondary | ICD-10-CM

## 2023-10-16 ENCOUNTER — Encounter: Payer: Self-pay | Admitting: Internal Medicine

## 2023-10-16 ENCOUNTER — Ambulatory Visit (INDEPENDENT_AMBULATORY_CARE_PROVIDER_SITE_OTHER): Payer: BC Managed Care – PPO | Admitting: Internal Medicine

## 2023-10-16 VITALS — BP 110/80 | HR 62 | Temp 98.0°F | Wt 137.0 lb

## 2023-10-16 DIAGNOSIS — R1031 Right lower quadrant pain: Secondary | ICD-10-CM | POA: Diagnosis not present

## 2023-10-16 DIAGNOSIS — R300 Dysuria: Secondary | ICD-10-CM

## 2023-10-16 LAB — POC URINALSYSI DIPSTICK (AUTOMATED)
Bilirubin, UA: NEGATIVE
Blood, UA: NEGATIVE
Glucose, UA: NEGATIVE
Ketones, UA: NEGATIVE
Leukocytes, UA: NEGATIVE
Nitrite, UA: NEGATIVE
Protein, UA: NEGATIVE
Spec Grav, UA: 1.02 (ref 1.010–1.025)
Urobilinogen, UA: 0.2 U/dL
pH, UA: 6 (ref 5.0–8.0)

## 2023-10-16 NOTE — Progress Notes (Signed)
     Established Patient Office Visit     CC/Reason for Visit: Rule out UTI  HPI: Terri Salazar is a 62 y.o. female who is coming in today for the above mentioned reasons.  She has been dealing with right groin pain now for close to a month.  I believe she has hip flexor tendinitis.  She wants to make sure she does not have a UTI.  No classic UTI symptoms.  Referral to sports medicine has been placed.  She found no significant improvement with home stretches and meloxicam that was given at a previous visit.   Past Medical/Surgical History: Past Medical History:  Diagnosis Date   Allergy    Cancer (HCC)    Right bundle branch block    since 62 years old    Past Surgical History:  Procedure Laterality Date   ABDOMINAL HYSTERECTOMY     COLONOSCOPY     ELBOW SURGERY Right    LAPAROSCOPIC PARTIAL COLECTOMY N/A 09/13/2015   Procedure: LAPAROSCOPIC LOW ANTERIOR RESECTION OF RECTUM WITH MOBILIZATION OF SPLENIC FLEXURE;  Surgeon: Avel Peace, MD;  Location: [facility];  Service: General;  Laterality: N/A;    Social History:  reports that she has never smoked. She has never used smokeless tobacco. She reports current alcohol use. She reports that she does not use drugs.  Allergies: No Known Allergies  Family History:  History reviewed. No pertinent family history.   Current Outpatient Medications:    diphenhydrAMINE (BENADRYL) 25 mg capsule, Take 25 mg by mouth at bedtime as needed for sleep. , Disp: , Rfl:    meloxicam (MOBIC) 7.5 MG tablet, TAKE 1 TABLET BY MOUTH EVERY DAY, Disp: 30 tablet, Rfl: 0   Multiple Vitamins-Minerals (MULTI FOR HER PO), Take 1 tablet by mouth daily. , Disp: , Rfl:    Polyethylene Glycol 3350 (MIRALAX PO), Take 17 g by mouth daily as needed (constipation). , Disp: , Rfl:    pseudoephedrine (SUDAFED) 30 MG tablet, Take 30 mg by mouth every 4 (four) hours as needed for congestion., Disp: , Rfl:   Review of Systems:  Negative unless indicated in  HPI.   Physical Exam: Vitals:   10/16/23 1331  BP: 110/80  Pulse: 62  Temp: 98 F (36.7 C)  TempSrc: Oral  SpO2: 99%  Weight: 137 lb (62.1 kg)    Body mass index is 20.83 kg/m.   Physical Exam Vitals reviewed.  Constitutional:      Appearance: Normal appearance.  HENT:     Head: Normocephalic and atraumatic.  Eyes:     Conjunctiva/sclera: Conjunctivae normal.  Skin:    General: Skin is warm and dry.  Neurological:     General: No focal deficit present.     Mental Status: She is alert and oriented to person, place, and time.  Psychiatric:        Mood and Affect: Mood normal.        Behavior: Behavior normal.        Thought Content: Thought content normal.        Judgment: Judgment normal.      Impression and Plan:  Dysuria -     POCT Urinalysis Dipstick (Automated)  Right groin pain   -In office urine dipstick is negative. -Follow-up with sports medicine as previously discussed.  Time spent:22 minutes reviewing chart, interviewing and examining patient and formulating plan of care.     Chaya Jan, MD [facility] at [facility]

## 2023-10-22 ENCOUNTER — Ambulatory Visit: Payer: BC Managed Care – PPO | Admitting: Family Medicine

## 2023-10-22 ENCOUNTER — Encounter: Payer: Self-pay | Admitting: Family Medicine

## 2023-10-22 ENCOUNTER — Ambulatory Visit (INDEPENDENT_AMBULATORY_CARE_PROVIDER_SITE_OTHER): Payer: BC Managed Care – PPO

## 2023-10-22 ENCOUNTER — Other Ambulatory Visit: Payer: Self-pay

## 2023-10-22 VITALS — BP 104/78 | HR 77 | Ht 68.0 in | Wt 135.0 lb

## 2023-10-22 DIAGNOSIS — Z85048 Personal history of other malignant neoplasm of rectum, rectosigmoid junction, and anus: Secondary | ICD-10-CM | POA: Diagnosis not present

## 2023-10-22 DIAGNOSIS — M25551 Pain in right hip: Secondary | ICD-10-CM

## 2023-10-22 NOTE — Patient Instructions (Signed)
Thank you for coming in today.   Please get an Xray today before you leave   I've referred you to Physical Therapy.  Let us know if you don't hear from them in one week.   Recheck in about 1 month.

## 2023-10-22 NOTE — Progress Notes (Signed)
   I, Stevenson Clinch, CMA acting as a scribe for Clementeen Graham, MD.  Terri Salazar is a 62 y.o. female who presents to [facility] today for R hip pain x 3 months. Pt locates pain to pubic bone. Has done UA and r/o UTI. Notes constant tenderness. Walks 2 miles daily, minimal pain. Also having some pain right lower back/ SI joint. Hx of fall down step ladder and stairs in 2020, fell on left. Hx of prolapsed uterus requiring surgical repair (2014).   Radiates: no Aggravates: palpation Treatments tried: stretches, Meloxicam, HEP  Pertinent review of systems: No fevers or chills  Relevant historical information: History of stage I rectal cancer status post excision in 2016. History of a humerus fracture after falling off of a 12 foot ladder occurring 4 years ago.   Exam:  BP 104/78   Pulse 77   Ht 5\' 8"  (1.727 m)   Wt 135 lb (61.2 kg)   SpO2 98%   BMI 20.53 kg/m  General: Well Developed, well nourished, and in no acute distress.   MSK: Hips bilaterally normal-appearing normal motion.  Tender palpation of pubic symphysis.  Normal hip strength without pain.    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  No acute fracture is visible within the pelvis.  No aggressive appearing bony lesions. No severe hip arthritis. Await formal radiology review    Assessment and Plan: 62 y.o. female with chronic pelvis pain.  Pain located overlying the pubic symphysis.  She is already had some trial of conservative management with meloxicam and home exercise directed by her PCP over the last month.  Plan to add formal physical therapy at a location that can do pelvic PT if needed.  Reassess in a month.  If not improved consider direct injection of the pubic symphysis versus advanced imaging.  She does have a cancer history.  It has been about 8 or 9  years since her cancer surgery so recurrence is quite unlikely and x-ray per my read is benign appearing.  PDMP not reviewed this encounter. Orders Placed This Encounter  Procedures   DG HIP UNILAT W OR W/O PELVIS 2-3 VIEWS RIGHT    Standing Status:   Future    Number of Occurrences:   1    Standing Expiration Date:   11/22/2023    Order Specific Question:   Reason for Exam (SYMPTOM  OR DIAGNOSIS REQUIRED)    Answer:   right hip pain    Order Specific Question:   Preferred imaging location?    Answer:   [facility]   Ambulatory referral to Physical Therapy    Referral Priority:   Routine    Referral Type:   Physical Medicine    Referral Reason:   Specialty Services Required    Requested Specialty:   Physical Therapy    Number of Visits Requested:   1   No orders of the defined types were placed in this encounter.    Discussed warning signs or symptoms. Please see discharge instructions. Patient expresses understanding.   The above documentation has been reviewed and is accurate and complete Clementeen Graham, M.D.

## 2023-10-24 NOTE — Therapy (Signed)
OUTPATIENT PHYSICAL THERAPY LOWER EXTREMITY EVALUATION   Patient Name: Terri Salazar MRN: [id number] DOB:[birth year], 62 y.o., female Today's Date: 10/25/2023  END OF SESSION:  PT End of Session - 10/25/23 1051     Visit Number 1    Date for PT Re-Evaluation 12/20/23    Authorization Type BCBS PPO    PT Start Time 1020    PT Stop Time 1050    PT Time Calculation (min) 30 min    Activity Tolerance Patient tolerated treatment well    Behavior During Therapy WFL for tasks assessed/performed             Past Medical History:  Diagnosis Date   Allergy    Cancer (HCC)    Right bundle branch block    since 62 years old   Past Surgical History:  Procedure Laterality Date   ABDOMINAL HYSTERECTOMY     COLONOSCOPY     ELBOW SURGERY Right    LAPAROSCOPIC PARTIAL COLECTOMY N/A 09/13/2015   Procedure: LAPAROSCOPIC LOW ANTERIOR RESECTION OF RECTUM WITH MOBILIZATION OF SPLENIC FLEXURE;  Surgeon: Avel Peace, MD;  Location: [facility];  Service: General;  Laterality: N/A;   Patient Active Problem List   Diagnosis Date Noted   Fracture, humerus, greater tuberosity 09/11/2019   History of rectal cancer 09/13/2015    PCP: Philip Aspen, Limmie Patricia, MD   REFERRING PROVIDER: Rodolph Bong, MD   REFERRING DIAG: [phone] (ICD-10-CM) - Right hip pain   THERAPY DIAG:  Pain in right hip  Cramp and spasm  Rationale for Evaluation and Treatment: Rehabilitation  ONSET DATE: mid September  SUBJECTIVE:   SUBJECTIVE STATEMENT: Pain in R pubic bone started. She used to just feel if she applied pressure, but now feels it more. Some LBP on R as well.   PERTINENT HISTORY: 2016 colorectal CA; 2023 last colonoscopy and cleared for 10 years. Multiple abdominal surgeries so a lot of scar tissue. Partial hysterectomy 2014, Walks 2 miles daily, minimal pain. Also having some pain right lower back/ SI joint. Hx of fall down step ladder and stairs in 2020, fell on left.  PAIN:  Are you  having pain? Yes: NPRS scale: 1/10 Pain location:  pubic symphysis and R SIJ Pain description: tender constant Aggravating factors: pressure Relieving factors: nothing  PRECAUTIONS: Other: lymph nodes removed inguinal  RED FLAGS: None   WEIGHT BEARING RESTRICTIONS: No  FALLS:  Has patient fallen in last 6 months? No  LIVING ENVIRONMENT: Lives with: lives with their spouse Lives in: House/apartment  OCCUPATION: retired, travels a lot  PLOF: Independent  PATIENT GOALS: get rid of pain and rule out cancer  NEXT MD VISIT: one month  OBJECTIVE:  Note: Objective measures were completed at Evaluation unless otherwise noted.  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  No acute fracture is visible within the pelvis.  No aggressive appearing bony lesions. No severe hip arthritis. Await formal radiology review  COGNITION: Overall cognitive status: Within functional limits for tasks assessed    MUSCLE LENGTH: B HS, piriformis L> R  POSTURE:  R anteriorly rotated innominate  PALPATION: Tender at pubic symphysis and SIJ; L gluteals  LOWER EXTREMITY ROM: WNL  LOWER EXTREMITY MMT:  MMT Right eval Left eval  Hip flexion 4+ 4+  Hip extension 4+ 5  Hip abduction 5 5  Hip adduction 5 5  Hip internal rotation    Hip external rotation    Knee flexion 5 5  Knee extension 5 5  Ankle dorsiflexion    Ankle plantarflexion    Ankle inversion    Ankle eversion     (Blank rows = not tested)    TODAY'S TREATMENT:                                                                                                                              DATE:   10/25/23 See pt ed and HEP Self corrrection of ant inominate with MET 3x 5sec hold with dowel - even pelvic and ankle landmarks after.  PATIENT EDUCATION:  Education details: PT eval findings, anticipated POC, initial HEP, and discussion of MOI for rotated innominates and how to self  check that she is even  Person educated: Patient Education method: Explanation, Demonstration, and Handouts Education comprehension: verbalized understanding and returned demonstration  HOME EXERCISE PROGRAM: Access Code: [facility] URL: [email or web address] Date: 10/25/2023 Prepared by: Raynelle Fanning  Exercises - 90/90 SI Joint Self-Correction with Dowel  - 1 x daily - 7 x weekly - 1 sets - 3 reps - 5 sec hold  ASSESSMENT:  CLINICAL IMPRESSION: Patient is a 62 y.o. female who was seen today for physical therapy evaluation and treatment for pubic symphysis pain. She presents with a R anteriorly rotated innominate. This was self-corrected with MET. Patient demos some hip weakness and flexibility deficits as well. She will benefit from skilled PT to address these deficits.  Plan is for patient to complete HEP for one month and then reassess if pain continues.   OBJECTIVE IMPAIRMENTS: decreased strength, impaired flexibility, postural dysfunction, and pain.   ACTIVITY LIMITATIONS:  pain is constant but not limiting  PARTICIPATION LIMITATIONS:  N/A  PERSONAL FACTORS: Age and Time since onset of injury/illness/exacerbation are also affecting patient's functional outcome.   REHAB POTENTIAL: Excellent  CLINICAL DECISION MAKING: Stable/uncomplicated  EVALUATION COMPLEXITY: Low   GOALS: Goals reviewed with patient? Yes  SHORT TERM GOALS: Target date: 11/22/2023   Patient will be independent with initial HEP. Baseline:  Goal status: INITIAL  2.  Decreased pain by >75% to improve QOL. Baseline:  Goal status: INITIAL  3.  Even postural landmarks Baseline:  Goal status: INITIAL   LONG TERM GOALS: Target date: 12/20/2023  TBD if pt returns    PLAN:  PT FREQUENCY:  1 visit in one month; if patient returns 1x/wk for 4 wks  PT DURATION: 8 weeks  PLANNED INTERVENTIONS: 97164- PT Re-evaluation, 97110-Therapeutic exercises, 97530- Therapeutic activity, 97112- Neuromuscular  re-education, 97535- Self Care, 01601- Manual therapy, 97014- Electrical stimulation (unattended), Patient/Family education, Taping, Dry Needling, Joint mobilization, Spinal mobilization, Cryotherapy, and Moist heat  PLAN FOR NEXT SESSION: Reassess pelvic landmarks. Add flexibility and hip strength. Set LTGs as needed.   Solon Palm, PT  10/25/2023, 12:04 PM

## 2023-10-25 ENCOUNTER — Ambulatory Visit: Payer: BC Managed Care – PPO | Attending: Family Medicine | Admitting: Physical Therapy

## 2023-10-25 ENCOUNTER — Encounter: Payer: Self-pay | Admitting: Physical Therapy

## 2023-10-25 ENCOUNTER — Other Ambulatory Visit: Payer: Self-pay

## 2023-10-25 DIAGNOSIS — R252 Cramp and spasm: Secondary | ICD-10-CM | POA: Insufficient documentation

## 2023-10-25 DIAGNOSIS — M25551 Pain in right hip: Secondary | ICD-10-CM | POA: Insufficient documentation

## 2023-11-05 ENCOUNTER — Telehealth: Payer: Self-pay

## 2023-11-05 NOTE — Telephone Encounter (Signed)
Called pt and left VM to call the office.  

## 2023-11-05 NOTE — Telephone Encounter (Signed)
Resulted by radiology but not yet reviewed by Dr. Denyse Amass as he is out of the office this week but will return on 11/12/23.    EXAM: DG HIP (WITH OR WITHOUT PELVIS) 3V RIGHT   COMPARISON:  None Available.   FINDINGS: There is mild bilateral hip degenerative change with joint space narrowing and small osteophytes. Pelvic ring is intact. No acute fracture, dislocation or subluxation. No osteolytic or osteoblastic lesions.   IMPRESSION: Mild bilateral hip degenerative changes. No acute osseous abnormalities.

## 2023-11-05 NOTE — Telephone Encounter (Signed)
Looks like it has been read. Can we follow up with patient on results?  ----- Message -----  From: Vevelyn Pat  Sent: 11/02/2023   4:48 PM EST  To: [facility]  Subject: Appointment Scheduled                            I came in on December 9 and had a pelvic X-ray. It has been two weeks and the radiologist still has not read the X-ray. Can you please check into this and give me a call at [phone].

## 2023-11-05 NOTE — Telephone Encounter (Signed)
 Copied from CRM [id number]. Topic: General - Other >> Nov 05, 2023  4:07 PM Florestine Avers wrote: Reason for CRM: Returning "Terri Salazar" call, will be waiting for her return phone call.

## 2023-11-12 NOTE — Telephone Encounter (Signed)
Spoke with patient and advised of results per Dr. Denyse Amass. Pt verbalized understanding. F/U already scheduled for 11/19/23.

## 2023-11-12 NOTE — Progress Notes (Signed)
Right hip x-ray shows mild arthritis of both hips.

## 2023-11-15 NOTE — Therapy (Signed)
 OUTPATIENT PHYSICAL THERAPY LOWER EXTREMITY TREATMENT   Patient Name: Terri Salazar MRN: [id number] DOB:15-Aug-1961, 63 y.o., female Today's Date: 11/19/2023  END OF SESSION:  PT End of Session - 11/19/23 1147     Visit Number 2    Date for PT Re-Evaluation 12/20/23    Authorization Type BCBS PPO    PT Start Time 1147    PT Stop Time 1232    PT Time Calculation (min) 45 min    Activity Tolerance Patient tolerated treatment well    Behavior During Therapy WFL for tasks assessed/performed              Past Medical History:  Diagnosis Date   Allergy    Cancer (HCC)    Right bundle branch block    since 62 years old   Past Surgical History:  Procedure Laterality Date   ABDOMINAL HYSTERECTOMY     COLONOSCOPY     ELBOW SURGERY Right    LAPAROSCOPIC PARTIAL COLECTOMY N/A 09/13/2015   Procedure: LAPAROSCOPIC LOW ANTERIOR RESECTION OF RECTUM WITH MOBILIZATION OF SPLENIC FLEXURE;  Surgeon: Krystal Russell, MD;  Location: [facility];  Service: General;  Laterality: N/A;   Patient Active Problem List   Diagnosis Date Noted   Fracture, humerus, greater tuberosity 09/11/2019   History of rectal cancer 09/13/2015    PCP: Theophilus Andrews, Tully GRADE, MD   REFERRING PROVIDER: Joane Artist RAMAN, MD   REFERRING DIAG: [phone] (ICD-10-CM) - Right hip pain   THERAPY DIAG:  Pain in right hip  Cramp and spasm  Rationale for Evaluation and Treatment: Rehabilitation  ONSET DATE: mid September  SUBJECTIVE:   SUBJECTIVE STATEMENT: The pain is the same.   PERTINENT HISTORY: 2016 colorectal CA; 2023 last colonoscopy and cleared for 10 years. Multiple abdominal surgeries so a lot of scar tissue. Partial hysterectomy 2014, Walks 2 miles daily, minimal pain. Also having some pain right lower back/ SI joint. Hx of fall down step ladder and stairs in 2020, fell on left.  PAIN:  Are you having pain? Yes: NPRS scale: 1/10 Pain location:  pubic symphysis and R SIJ Pain description: tender  constant Aggravating factors: pressure Relieving factors: nothing  PRECAUTIONS: Other: lymph nodes removed inguinal  RED FLAGS: None   WEIGHT BEARING RESTRICTIONS: No  FALLS:  Has patient fallen in last 6 months? No  LIVING ENVIRONMENT: Lives with: lives with their spouse Lives in: House/apartment  OCCUPATION: retired, travels a lot  PLOF: Independent  PATIENT GOALS: get rid of pain and rule out cancer  NEXT MD VISIT: one month  OBJECTIVE:  Note: Objective measures were completed at Evaluation unless otherwise noted.  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  No acute fracture is visible within the pelvis.  No aggressive appearing bony lesions. No severe hip arthritis. Await formal radiology review  COGNITION: Overall cognitive status: Within functional limits for tasks assessed    MUSCLE LENGTH: B HS, piriformis L> R  POSTURE:  R anteriorly rotated innominate  PALPATION: Tender at pubic symphysis and SIJ; L gluteals  LOWER EXTREMITY ROM: WNL  LOWER EXTREMITY MMT:  MMT Right eval Left eval  Hip flexion 4+ 4+  Hip extension 4+ 5  Hip abduction 5 5  Hip adduction 5 5  Hip internal rotation    Hip external rotation    Knee flexion 5 5  Knee extension 5 5  Ankle dorsiflexion    Ankle plantarflexion    Ankle inversion  Ankle eversion     (Blank rows = not tested)    TODAY'S TREATMENT:                                                                                                                              DATE:   11/19/23 Pelvic landmarks assessed - normal alignment Seated fig 4 3x30 sec Standing hip ADD stretch x 30 sec (challenging to quads) Kneeling hip ADD stretch 2 x 30 sec B Supine HS stretch 2x30 sec B Supine ADDuctor stretch with strap x 30 sec B Supine ITB stretch x 30 sec B Supine hip flexor strengthening in 90/90 position (blue loop/yellow for HEP) crisscrossed on feet - unilateral leg  press 2x10 Single leg bridge 2x10 B   10/25/23 See pt ed and HEP Self corrrection of ant inominate with MET 3x 5sec hold with dowel - even pelvic and ankle landmarks after.  PATIENT EDUCATION:  Education details: PT eval findings, anticipated POC, initial HEP, and discussion of MOI for rotated innominates and how to self check that she is even  Person educated: Patient Education method: Explanation, Demonstration, and Handouts Education comprehension: verbalized understanding and returned demonstration  HOME EXERCISE PROGRAM: Access Code: [facility] URL: [email or web address] Date: 11/19/2023 Prepared by: Mliss  Exercises - 90/90 SI Joint Self-Correction with Dowel  - 1 x daily - 7 x weekly - 1 sets - 3 reps - 5 sec hold - Seated Piriformis Stretch with Trunk Bend  - 2 x daily - 7 x weekly - 1 sets - 3 reps - 30-60 sec  hold - Kneeling Adductor Stretch with Hip External Rotation  - 1 x daily - 7 x weekly - 1 sets - 3 reps - 30-60 sec hold - Hip Adductors and Hamstring Stretch with Strap  - 2 x daily - 7 x weekly - 1 sets - 3 reps - 60 seconds hold - Supine Hamstring Stretch with Strap  - 2 x daily - 7 x weekly - 1 sets - 3 reps - 30 sec hold - Supine ITB Stretch with Strap  - 2 x daily - 7 x weekly - 1 sets - 3 reps - 60 sec hold - Supine Hip Adductor Stretch  - 2 x daily - 7 x weekly - 1 sets - 3 reps - 60 sec  hold - Hip flexor strengthening 90/90  - 1 x daily - 3 x weekly - 2 sets - 10 reps - Single Leg Bridge  - 1 x daily - 3-4 x weekly - 1 sets - 10 reps  ASSESSMENT:  CLINICAL IMPRESSION: Laresha returns for first f/u visit after evaluation with reports of no improvement in pain. She does demonstrate even pelvic landmarks however and had no pain with palpation to her R SIJ today. She demonstrates significant tightness in her hip musculature and so we focused on this today. She plans to perform these for a few weeks and then return if pain continues.  She may request a  referral to pelvic floor PT when she sees her gynecologist. She also reports all over joint pain and wants some answers to this as well.    OBJECTIVE IMPAIRMENTS: decreased strength, impaired flexibility, postural dysfunction, and pain.   ACTIVITY LIMITATIONS:  pain is constant but not limiting  PARTICIPATION LIMITATIONS:  N/A  PERSONAL FACTORS: Age and Time since onset of injury/illness/exacerbation are also affecting patient's functional outcome.   REHAB POTENTIAL: Excellent  CLINICAL DECISION MAKING: Stable/uncomplicated  EVALUATION COMPLEXITY: Low   GOALS: Goals reviewed with patient? Yes  SHORT TERM GOALS: Target date: 11/22/2023   Patient will be independent with initial HEP. Baseline:  Goal status: MET  2.  Decreased pain by >75% to improve QOL. Baseline:  Goal status: NOT MET  3.  Even postural landmarks Baseline:  Goal status: MET   LONG TERM GOALS: Target date: 12/20/2023    1.  Patient Ind with advanced HEP Baseline:  Goal status: INITIAL  2. LTG = STG #2  Baseline:  Goal status: IN PROGRESS  3.  Improved hip flexor and ext strength to 5/5 to help stabilize pelvis and core. Baseline:  Goal status: INITIAL        PLAN:  PT FREQUENCY:  1 visit in one month; if patient returns 1x/wk for 4 wks  PT DURATION: 8 weeks  PLANNED INTERVENTIONS: 97164- PT Re-evaluation, 97110-Therapeutic exercises, 97530- Therapeutic activity, 97112- Neuromuscular re-education, 97535- Self Care, 02859- Manual therapy, 97014- Electrical stimulation (unattended), Patient/Family education, Taping, Dry Needling, Joint mobilization, Spinal mobilization, Cryotherapy, and Moist heat  PLAN FOR NEXT SESSION: Reassess pelvic landmarks, goals and response to new exercises.   Mliss Cummins, PT  11/19/2023, 1:41 PM

## 2023-11-16 NOTE — Progress Notes (Deleted)
   LILLETTE Ileana Collet, PhD, LAT, ATC acting as a scribe for Artist Lloyd, MD.  Terri Salazar is a 63 y.o. female who presents to [facility] at [facility] today for f/u R hip pain. Pt was last seen by Dr. Lloyd on 10/22/23 and was referred to PT, completing 1 visit.  Today, pt reports ***  Dx imaging: 10/22/23 R hip XR  Pertinent review of systems: ***  Relevant historical information: ***   Exam:  There were no vitals taken for this visit. General: Well Developed, well nourished, and in no acute distress.   MSK: ***    Lab and Radiology Results No results found for this or any previous visit (from the past 72 hours). No results found.     Assessment and Plan: 63 y.o. female with ***   PDMP not reviewed this encounter. No orders of the defined types were placed in this encounter.  No orders of the defined types were placed in this encounter.    Discussed warning signs or symptoms. Please see discharge instructions. Patient expresses understanding.   ***

## 2023-11-19 ENCOUNTER — Ambulatory Visit: Payer: BC Managed Care – PPO | Attending: Family Medicine | Admitting: Physical Therapy

## 2023-11-19 ENCOUNTER — Encounter: Payer: Self-pay | Admitting: Physical Therapy

## 2023-11-19 ENCOUNTER — Ambulatory Visit: Payer: BC Managed Care – PPO | Admitting: Family Medicine

## 2023-11-19 DIAGNOSIS — M25551 Pain in right hip: Secondary | ICD-10-CM | POA: Insufficient documentation

## 2023-11-19 DIAGNOSIS — R252 Cramp and spasm: Secondary | ICD-10-CM | POA: Insufficient documentation

## 2023-11-21 NOTE — Progress Notes (Signed)
   LILLETTE Ileana Collet, PhD, LAT, ATC acting as a scribe for Artist Lloyd, MD.  Terri Salazar is a 63 y.o. female who presents to [facility] at [facility] today for f/u R hip pain. Pt was last seen by Dr. Lloyd on 10/22/23 and was referred to PT, completing 2 visit.  Today, pt reports PT gave her some HEP and stretching to do that have been helpful. R hip pain is somewhat better. She no longer has the soreness along her pubic bone.    Dx imaging: 10/22/23 R hip XR  Pertinent review of systems: No fevers or chills.  Positive for myalgias and arthralgias.  Relevant historical information: History of rectal cancer   Exam:  BP 108/80   Pulse 81   Ht 5' 8 (1.727 m)   Wt 133 lb (60.3 kg)   SpO2 97%   BMI 20.22 kg/m  General: Well Developed, well nourished, and in no acute distress.   MSK: Normal hip motion.      Assessment and Plan: 63 y.o. female with improved hip and pelvis pain located primarily at the pubic symphysis.  This is improvement with physical therapy.  She does have remaining lateral hip pain also improving with PT.  Plan to continue PT and home exercise program and check back for this issue.  She also notes generalized bodyaches and pains located in bilateral hands left elbow both hips both knees and elsewhere.  She has never had a rheumatologic workup.  She denies any fevers or chills.  Plan for rheumatologic assessment listed below.  Check back with me as needed.  Will refer to rheumatology if labs are positive enough.   PDMP not reviewed this encounter. Orders Placed This Encounter  Procedures   ANA    Standing Status:   Future    Expiration Date:   11/21/2024   Cyclic citrul peptide antibody, IgG    Standing Status:   Future    Expiration Date:   11/21/2024   HLA-B27 antigen    Polyarthalgia    Standing Status:   Future    Expiration Date:   11/21/2024   Rheumatoid factor    Standing Status:   Future    Expiration Date:   11/21/2024    Sedimentation rate    Standing Status:   Future    Expiration Date:   11/21/2024   No orders of the defined types were placed in this encounter.    Discussed warning signs or symptoms. Please see discharge instructions. Patient expresses understanding.   The above documentation has been reviewed and is accurate and complete Artist Lloyd, M.D.

## 2023-11-22 ENCOUNTER — Ambulatory Visit: Payer: BC Managed Care – PPO | Admitting: Family Medicine

## 2023-11-22 VITALS — BP 108/80 | HR 81 | Ht 68.0 in | Wt 133.0 lb

## 2023-11-22 DIAGNOSIS — M25551 Pain in right hip: Secondary | ICD-10-CM

## 2023-11-22 DIAGNOSIS — M2550 Pain in unspecified joint: Secondary | ICD-10-CM | POA: Diagnosis not present

## 2023-11-22 LAB — SEDIMENTATION RATE: Sed Rate: 5 mm/h (ref 0–30)

## 2023-11-22 NOTE — Patient Instructions (Addendum)
 Thank you for coming in today.   Please get labs today before you leave   Continue PT and home exercises  Check back with me as needed

## 2023-11-26 LAB — HLA-B27 ANTIGEN: HLA-B27 Antigen: NEGATIVE

## 2023-11-26 LAB — RHEUMATOID FACTOR: Rheumatoid fact SerPl-aCnc: <10 [IU]/mL (ref <14)

## 2023-11-26 LAB — CYCLIC CITRUL PEPTIDE ANTIBODY, IGG: Cyclic Citrullin Peptide Ab: <16 U

## 2023-11-26 LAB — ANA: Anti Nuclear Antibody (ANA): NEGATIVE

## 2023-11-27 NOTE — Progress Notes (Signed)
 Rheumatologic labs are reassuring.  They look normal to me.

## 2023-11-28 ENCOUNTER — Encounter: Payer: Self-pay | Admitting: Family Medicine

## 2023-12-11 ENCOUNTER — Telehealth: Payer: Self-pay | Admitting: Internal Medicine

## 2023-12-11 NOTE — Telephone Encounter (Signed)
Medical form for travel to be filled out; placed in dr's folder.  Call [phone] upon completion.

## 2023-12-11 NOTE — Telephone Encounter (Signed)
Placed in Dr Hardie Shackleton folder

## 2023-12-17 DIAGNOSIS — Z0279 Encounter for issue of other medical certificate: Secondary | ICD-10-CM

## 2023-12-17 NOTE — Telephone Encounter (Signed)
Form is ready to be picked up.  There is a fee.  Patient is aware.

## 2024-02-08 LAB — LAB REPORT - SCANNED
A1c: 5.3
EGFR: 81
